# Patient Record
Sex: Male | Born: 1965 | Race: White | Hispanic: No | Marital: Married | Smoking: Former smoker
Health system: Southern US, Community
[De-identification: ages and names within clinical notes are randomized; demographics above are authoritative.]

## PROBLEM LIST (undated history)

## (undated) DIAGNOSIS — F419 Anxiety disorder, unspecified: Secondary | ICD-10-CM

## (undated) DIAGNOSIS — R7302 Impaired glucose tolerance (oral): Secondary | ICD-10-CM

## (undated) DIAGNOSIS — E785 Hyperlipidemia, unspecified: Secondary | ICD-10-CM

## (undated) DIAGNOSIS — K219 Gastro-esophageal reflux disease without esophagitis: Secondary | ICD-10-CM

## (undated) DIAGNOSIS — I10 Essential (primary) hypertension: Secondary | ICD-10-CM

## (undated) DIAGNOSIS — M62838 Other muscle spasm: Secondary | ICD-10-CM

## (undated) HISTORY — DX: Impaired glucose tolerance (oral): R73.02

## (undated) HISTORY — DX: Anxiety disorder, unspecified: F41.9

## (undated) HISTORY — DX: Essential (primary) hypertension: I10

## (undated) HISTORY — DX: Hyperlipidemia, unspecified: E78.5

## (undated) HISTORY — PX: APPENDECTOMY: SHX54

## (undated) HISTORY — DX: Gastro-esophageal reflux disease without esophagitis: K21.9

---

## 1999-03-13 ENCOUNTER — Emergency Department (HOSPITAL_COMMUNITY): Admission: EM | Admit: 1999-03-13 | Discharge: 1999-03-13 | Payer: Self-pay | Admitting: Emergency Medicine

## 1999-03-27 ENCOUNTER — Encounter: Admission: RE | Admit: 1999-03-27 | Discharge: 1999-03-27 | Payer: Self-pay | Admitting: Hematology and Oncology

## 1999-04-04 ENCOUNTER — Ambulatory Visit (HOSPITAL_COMMUNITY): Admission: RE | Admit: 1999-04-04 | Discharge: 1999-04-04 | Payer: Self-pay | Admitting: Internal Medicine

## 2006-04-25 ENCOUNTER — Emergency Department (HOSPITAL_COMMUNITY): Admission: EM | Admit: 2006-04-25 | Discharge: 2006-04-25 | Payer: Self-pay | Admitting: Emergency Medicine

## 2006-05-02 ENCOUNTER — Emergency Department (HOSPITAL_COMMUNITY): Admission: EM | Admit: 2006-05-02 | Discharge: 2006-05-02 | Payer: Self-pay | Admitting: Emergency Medicine

## 2011-01-13 ENCOUNTER — Other Ambulatory Visit: Payer: Self-pay | Admitting: Oral and Maxillofacial Surgery

## 2011-01-13 ENCOUNTER — Inpatient Hospital Stay (HOSPITAL_COMMUNITY)
Admission: AD | Admit: 2011-01-13 | Discharge: 2011-01-17 | DRG: 477 | Disposition: A | Discharge status: Home / Self Care | Payer: Self-pay | Source: Ambulatory Visit | Attending: Oral and Maxillofacial Surgery | Admitting: Oral and Maxillofacial Surgery

## 2011-01-13 ENCOUNTER — Inpatient Hospital Stay (HOSPITAL_COMMUNITY): Payer: Self-pay

## 2011-01-13 DIAGNOSIS — M629 Disorder of muscle, unspecified: Principal | ICD-10-CM | POA: Diagnosis present

## 2011-01-13 DIAGNOSIS — A419 Sepsis, unspecified organism: Secondary | ICD-10-CM | POA: Diagnosis not present

## 2011-01-13 DIAGNOSIS — Z23 Encounter for immunization: Secondary | ICD-10-CM

## 2011-01-13 DIAGNOSIS — M2420 Disorder of ligament, unspecified site: Principal | ICD-10-CM | POA: Diagnosis present

## 2011-01-13 DIAGNOSIS — H532 Diplopia: Secondary | ICD-10-CM | POA: Diagnosis not present

## 2011-01-13 DIAGNOSIS — M2749 Other cysts of jaw: Secondary | ICD-10-CM | POA: Diagnosis present

## 2011-01-13 DIAGNOSIS — K029 Dental caries, unspecified: Secondary | ICD-10-CM | POA: Diagnosis present

## 2011-01-13 DIAGNOSIS — R6520 Severe sepsis without septic shock: Secondary | ICD-10-CM | POA: Diagnosis not present

## 2011-01-13 DIAGNOSIS — J9600 Acute respiratory failure, unspecified whether with hypoxia or hypercapnia: Secondary | ICD-10-CM | POA: Diagnosis not present

## 2011-01-13 DIAGNOSIS — J988 Other specified respiratory disorders: Secondary | ICD-10-CM | POA: Diagnosis not present

## 2011-01-13 LAB — POCT I-STAT 3, ART BLOOD GAS (G3+)
O2 Saturation: 99 %
Patient temperature: 39.2
TCO2: 26 mmol/L (ref 0–100)
pH, Arterial: 7.366 (ref 7.350–7.450)

## 2011-01-14 ENCOUNTER — Inpatient Hospital Stay (HOSPITAL_COMMUNITY): Payer: Self-pay

## 2011-01-14 DIAGNOSIS — K122 Cellulitis and abscess of mouth: Secondary | ICD-10-CM

## 2011-01-14 DIAGNOSIS — A419 Sepsis, unspecified organism: Secondary | ICD-10-CM

## 2011-01-14 DIAGNOSIS — F05 Delirium due to known physiological condition: Secondary | ICD-10-CM

## 2011-01-14 DIAGNOSIS — J9600 Acute respiratory failure, unspecified whether with hypoxia or hypercapnia: Secondary | ICD-10-CM

## 2011-01-14 LAB — BASIC METABOLIC PANEL
BUN: 18 mg/dL (ref 6–23)
Chloride: 103 mEq/L (ref 96–112)
GFR calc Af Amer: >60 mL/min (ref >60)
GFR calc non Af Amer: 60 mL/min — ABNORMAL LOW (ref >60)
Potassium: 4.6 mEq/L (ref 3.5–5.1)
Sodium: 137 mEq/L (ref 135–145)

## 2011-01-14 LAB — COMPREHENSIVE METABOLIC PANEL
ALT: 23 U/L (ref 0–53)
AST: 29 U/L (ref 0–37)
CO2: 21 mEq/L (ref 19–32)
Calcium: 8.5 mg/dL (ref 8.4–10.5)
Chloride: 99 mEq/L (ref 96–112)
Creatinine, Ser: 1.44 mg/dL (ref 0.4–1.5)
GFR calc non Af Amer: 53 mL/min — ABNORMAL LOW (ref >60)
Glucose, Bld: 180 mg/dL — ABNORMAL HIGH (ref 70–99)
Total Bilirubin: 3.2 mg/dL — ABNORMAL HIGH (ref 0.3–1.2)

## 2011-01-14 LAB — CBC
MCH: 31.8 pg (ref 26.0–34.0)
MCHC: 34.7 g/dL (ref 30.0–36.0)
MCV: 91.4 fL (ref 78.0–100.0)
Platelets: 190 10*3/uL (ref 150–400)
Platelets: 205 10*3/uL (ref 150–400)
RBC: 4.52 MIL/uL (ref 4.22–5.81)
RDW: 15.1 % (ref 11.5–15.5)
WBC: 29.8 10*3/uL — ABNORMAL HIGH (ref 4.0–10.5)

## 2011-01-14 LAB — DIFFERENTIAL
Basophils Relative: 0 % (ref 0–1)
Eosinophils Absolute: 0 10*3/uL (ref 0.0–0.7)
Lymphs Abs: 0.9 10*3/uL (ref 0.7–4.0)
Monocytes Absolute: 2.2 10*3/uL — ABNORMAL HIGH (ref 0.1–1.0)
Neutro Abs: 28.4 10*3/uL — ABNORMAL HIGH (ref 1.7–7.7)

## 2011-01-14 LAB — GLUCOSE, CAPILLARY
Glucose-Capillary: 184 mg/dL — ABNORMAL HIGH (ref 70–99)
Glucose-Capillary: 168 mg/dL — ABNORMAL HIGH (ref 70–99)
Glucose-Capillary: 140 mg/dL — ABNORMAL HIGH (ref 70–99)
Glucose-Capillary: 159 mg/dL — ABNORMAL HIGH (ref 70–99)

## 2011-01-14 LAB — POCT I-STAT 3, ART BLOOD GAS (G3+)
Patient temperature: 98.9
TCO2: 24 mmol/L (ref 0–100)
pCO2 arterial: 40.8 mmHg (ref 35.0–45.0)
pH, Arterial: 7.358 (ref 7.350–7.450)

## 2011-01-14 LAB — URINALYSIS, ROUTINE W REFLEX MICROSCOPIC
Ketones, ur: NEGATIVE mg/dL
Protein, ur: NEGATIVE mg/dL
Urine Glucose, Fasting: 250 mg/dL — AB
Urobilinogen, UA: 4 mg/dL — ABNORMAL HIGH (ref 0.0–1.0)

## 2011-01-14 LAB — URINE MICROSCOPIC-ADD ON

## 2011-01-14 LAB — POCT I-STAT, CHEM 8
Calcium, Ion: 1.13 mmol/L (ref 1.12–1.32)
Chloride: 102 mEq/L (ref 96–112)
Creatinine, Ser: 1.5 mg/dL (ref 0.4–1.5)
Glucose, Bld: 173 mg/dL — ABNORMAL HIGH (ref 70–99)
Hemoglobin: 16 g/dL (ref 13.0–17.0)
Potassium: 4.1 mEq/L (ref 3.5–5.1)

## 2011-01-14 LAB — LACTIC ACID, PLASMA: Lactic Acid, Venous: 1.7 mmol/L (ref 0.5–2.2)

## 2011-01-14 LAB — PROTIME-INR: Prothrombin Time: 15.4 seconds — ABNORMAL HIGH (ref 11.6–15.2)

## 2011-01-15 LAB — GLUCOSE, CAPILLARY
Glucose-Capillary: 109 mg/dL — ABNORMAL HIGH (ref 70–99)
Glucose-Capillary: 126 mg/dL — ABNORMAL HIGH (ref 70–99)
Glucose-Capillary: 126 mg/dL — ABNORMAL HIGH (ref 70–99)
Glucose-Capillary: 156 mg/dL — ABNORMAL HIGH (ref 70–99)
Glucose-Capillary: 157 mg/dL — ABNORMAL HIGH (ref 70–99)
Glucose-Capillary: 167 mg/dL — ABNORMAL HIGH (ref 70–99)

## 2011-01-15 LAB — BASIC METABOLIC PANEL
Chloride: 100 mEq/L (ref 96–112)
GFR calc Af Amer: >60 mL/min (ref >60)
GFR calc non Af Amer: >60 mL/min (ref >60)
Potassium: 4.5 mEq/L (ref 3.5–5.1)

## 2011-01-15 LAB — CBC
Platelets: 227 10*3/uL (ref 150–400)
RBC: 4.47 MIL/uL (ref 4.22–5.81)
RDW: 15.1 % (ref 11.5–15.5)
WBC: 27.4 10*3/uL — ABNORMAL HIGH (ref 4.0–10.5)

## 2011-01-15 LAB — POCT I-STAT 4, (NA,K, GLUC, HGB,HCT)
Potassium: 3.9 mEq/L (ref 3.5–5.1)
Sodium: 134 mEq/L — ABNORMAL LOW (ref 135–145)

## 2011-01-16 LAB — CBC
Hemoglobin: 14.2 g/dL (ref 13.0–17.0)
MCH: 31.9 pg (ref 26.0–34.0)
MCHC: 35.1 g/dL (ref 30.0–36.0)
MCV: 90.8 fL (ref 78.0–100.0)

## 2011-01-16 LAB — GLUCOSE, CAPILLARY
Glucose-Capillary: 121 mg/dL — ABNORMAL HIGH (ref 70–99)
Glucose-Capillary: 116 mg/dL — ABNORMAL HIGH (ref 70–99)

## 2011-01-17 ENCOUNTER — Other Ambulatory Visit (HOSPITAL_COMMUNITY): Payer: Self-pay

## 2011-01-17 ENCOUNTER — Inpatient Hospital Stay (HOSPITAL_COMMUNITY)
Admission: EM | Admit: 2011-01-17 | Discharge: 2011-01-19 | DRG: 502 | Disposition: A | Discharge status: Home / Self Care | Payer: Self-pay | Attending: Oral and Maxillofacial Surgery | Admitting: Oral and Maxillofacial Surgery

## 2011-01-17 ENCOUNTER — Emergency Department (HOSPITAL_COMMUNITY): Payer: Self-pay

## 2011-01-17 DIAGNOSIS — M2420 Disorder of ligament, unspecified site: Principal | ICD-10-CM | POA: Diagnosis present

## 2011-01-17 DIAGNOSIS — K029 Dental caries, unspecified: Secondary | ICD-10-CM | POA: Diagnosis present

## 2011-01-17 DIAGNOSIS — M629 Disorder of muscle, unspecified: Principal | ICD-10-CM | POA: Diagnosis present

## 2011-01-17 DIAGNOSIS — M2749 Other cysts of jaw: Secondary | ICD-10-CM | POA: Diagnosis present

## 2011-01-17 LAB — POCT I-STAT, CHEM 8
BUN: 17 mg/dL (ref 6–23)
Calcium, Ion: 1.07 mmol/L — ABNORMAL LOW (ref 1.12–1.32)
Creatinine, Ser: 0.9 mg/dL (ref 0.4–1.5)
Glucose, Bld: 104 mg/dL — ABNORMAL HIGH (ref 70–99)
Hemoglobin: 15.3 g/dL (ref 13.0–17.0)
Sodium: 132 mEq/L — ABNORMAL LOW (ref 135–145)
TCO2: 25 mmol/L (ref 0–100)

## 2011-01-17 LAB — CBC
MCH: 31.6 pg (ref 26.0–34.0)
MCHC: 35.8 g/dL (ref 30.0–36.0)
MCV: 88.1 fL (ref 78.0–100.0)
Platelets: 300 10*3/uL (ref 150–400)
RBC: 4.88 MIL/uL (ref 4.22–5.81)

## 2011-01-17 LAB — DIFFERENTIAL
Basophils Relative: 5 % — ABNORMAL HIGH (ref 0–1)
Eosinophils Absolute: 0.2 10*3/uL (ref 0.0–0.7)
Eosinophils Relative: 1 % (ref 0–5)
Lymphs Abs: 2.7 10*3/uL (ref 0.7–4.0)
Monocytes Absolute: 3 10*3/uL — ABNORMAL HIGH (ref 0.1–1.0)
Neutro Abs: 15.7 10*3/uL — ABNORMAL HIGH (ref 1.7–7.7)
Neutrophils Relative %: 69 % (ref 43–77)

## 2011-01-17 LAB — COMPREHENSIVE METABOLIC PANEL
AST: 24 U/L (ref 0–37)
Albumin: 3.1 g/dL — ABNORMAL LOW (ref 3.5–5.2)
BUN: 15 mg/dL (ref 6–23)
Calcium: 8.9 mg/dL (ref 8.4–10.5)
Chloride: 94 mEq/L — ABNORMAL LOW (ref 96–112)
Creatinine, Ser: 0.79 mg/dL (ref 0.4–1.5)
GFR calc Af Amer: >60 mL/min (ref >60)
GFR calc non Af Amer: >60 mL/min (ref >60)
Total Bilirubin: 2.2 mg/dL — ABNORMAL HIGH (ref 0.3–1.2)

## 2011-01-17 LAB — GLUCOSE, CAPILLARY: Glucose-Capillary: 112 mg/dL — ABNORMAL HIGH (ref 70–99)

## 2011-01-17 MED ORDER — IOHEXOL 300 MG/ML  SOLN
75.0000 mL | Freq: Once | INTRAMUSCULAR | Status: AC | PRN
  Administered 2011-01-17: 75 mL via INTRAVENOUS

## 2011-01-18 LAB — GLUCOSE, CAPILLARY: Glucose-Capillary: 133 mg/dL — ABNORMAL HIGH (ref 70–99)

## 2011-01-18 LAB — CULTURE, ROUTINE-ABSCESS: Gram Stain: NONE SEEN

## 2011-01-18 LAB — ANAEROBIC CULTURE

## 2011-01-19 LAB — CBC
Platelets: 388 10*3/uL (ref 150–400)
RDW: 14.9 % (ref 11.5–15.5)
WBC: 21.8 10*3/uL — ABNORMAL HIGH (ref 4.0–10.5)

## 2011-01-19 LAB — BASIC METABOLIC PANEL
GFR calc non Af Amer: >60 mL/min (ref >60)
Potassium: 4.7 mEq/L (ref 3.5–5.1)
Sodium: 132 mEq/L — ABNORMAL LOW (ref 135–145)

## 2011-01-21 NOTE — Op Note (Signed)
Ronnie Diaz, Ronnie [name] NO.:  [id number]  MEDICAL RECORD NO.:  [id number]           PATIENT TYPE:  I  LOCATION:  2302                         FACILITY:  [facility]  PHYSICIAN:  Lincoln Brigham, DDS DATE OF BIRTH:  1966/03/12  DATE OF PROCEDURE:  01/13/2011 DATE OF DISCHARGE:  01/17/2011                              OPERATIVE REPORT   PREOPERATIVE DIAGNOSES:  Left fascial space infection of the head and neck, cyst of left mandible and carious nonrestorable teeth numbers 17 and 18.  POSTOPERATIVE DIAGNOSES:  Left fascial space infection of the head and neck, cyst of left mandible, and nonrestorable carious teeth numbers 17 and 18.  SURGEON:  Adeja Sarratt L. Hickman, DDS  NAME OF OPERATION:  Extraction of teeth #17 and 18, incision and drainage of left temporal, left vestibular, left buccal, left submandibular, left pterygomandibular, left lateral pharyngeal space infections and biopsy of cyst of left mandible.  INDICATIONS FOR PROCEDURE:  The patient is a 45 year old white male who presented to my office on Monday, January 13, 2011, with the chief complaint of left facial swelling.  Upon exam, the patient was noted to have slightly mobile #17 and 18.  The patient also had left facial swelling which was significant and submandibular region extending up to the temporal region.  The patient also had trismus with opening only about 5 mm.  The patient could not protrude the tongue between his teeth.  The patient was also febrile.  At that point it was deemed necessary that the patient will be taken to the operating room for incision and drainage of the left fascial spaces and removal of the teeth.  Panorex exam revealed the patient had a large odontogenic cyst in the left mandible which apparently was secondarily infected from teeth #17 and 18.  CT scan revealed the patient had soft tissue mass effect which was resulting in narrowing of airway.  This allowed  the surgery to be deemed as urgent as the patient had pending airway doom.  PROCEDURE IN DETAILS:  The patient was seen in the preoperative area. Consent was verified and signed by the patient.  The patient's history and physical was updated and verified.  All the patient's questions were answered.  The patient was taken to the operating room by Anesthesia. The patient was then placed on the bed in a supine position.  The patient had awake fiberoptic intubation using a nasal endotracheal tube. At this point tube was secured and the patient was turned over to the care of myself.  The patient was prepped and draped in the usual sterile fashion for maxillofacial surgery procedure.  A throat pack was then placed which was a moistened Ray-Tec in the patient's oropharynx.  At this point local anesthesia was then used to anesthetize the face and intraorally, approximately 5.4 mL of 2% lidocaine with 1:100 epinephrine were used to anesthetize the left temporal region, the left submandibular region, and then intraorally the tissue surrounding teeth #17 and 18 along with the left inferior alveolar nerve block.  At this point a 15 blade was then used to make an incision 2 cm below the inferior border  in the left mandible and then this incision was carried down through skin and subcutaneous tissue.  At this point hemostat was used to bluntly dissect to the inferior border of the mandible and then to the medial aspect of the mandible.  Of note, an 18-gauge needle on a 10 mL syringe was then used to aspirate purulent material.  First attempt was extraorally and then the second attempt was intraorally aspirated approximately 3 mL of purulent matter which was sent off to microbiology for culture.  This was done prior to making extraoral incisions.  Hemostat was then used to dissect along the mandible on the medial aspect through the submandibular space, pterygomandibular space, and then the lateral  pharyngeal spaces.  Copious purulence were then expressed from the incision site.  At this point the hemostat was then extended to the buccal and vestibular space on the lateral aspect of the mandible.  Minimal purulence was found there.  Then our attention was turned to the left temporal region, 15 blade was then used to make approximately a 5-mm incision in the region of the left temporal area consistent with a Cheryll Dessert approach.  The incision was carried through skin and subcutaneous tissue.  Then a hemostat was then used to dissect down to the temporalis fascia.  This was carried through the superficial temporal space and then deep into the deep temporal space.  No purulence was returned from this area.  At this point we turned our attention to the intraoral region.  A mouth prop was then used to open the patient's mouth and then teeth #17 and 18 were then removed using a 15 blade periosteal and then dental elevators.  Teeth were delivered with a forceps.  At this point a rongeur was then used to remove the intraseptal bone between #17 and 18 to access the cystic area associated with lesion in the mandible.  A 15 blade was then used to incise a portion of the cystic lining to perform an incisional biopsy.  This specimen was then sent for pathology.  During the biopsy copious amounts of keratin-like material were expressed from the cyst along with purulent drainage.  Copious irrigation using normal saline was then used to irrigate the cystic area along with the extraction sockets.  Of note, the cyst was inferior to the roots of #17 and 18.  The cyst extended anteriorly according to the Panorex dental x-ray and CT scan into the premolar region.  At this point a second round of irrigation was then used and then our attention was turned extraorally once more and we placed quarter of an inch Penrose drains at the temporal region and then an additional Penrose drain was placed into the  submandibular, pterygomandibular, lateral pharyngeal spaces and then a third Penrose drain which was quarter of an inch was placed on the lateral aspect of the mandible into the buccal and vestibular spaces.  All three Penrose drains were secured with 2-0 silk sutures and drain sponges applied.  At this point, throat pack was then removed from the patient's mouth.  The patient was then turned over to the care of anesthesia.  The patient remained intubated at the conclusion of the case.  Due to the patient's edematous airway, an NG tube was placed into the patient's nose and into the stomach for the purpose of having postoperative feedings.  The patient was then taken to the critical care ICU in a sedated fashion.  FINDINGS:  Copious amounts of purulent drainage from the  left fascial spaces and a cyst associated with the left mandible.  SPECIMENS REMOVED:  Incisional biopsy of the left mandibular cyst and culture of the purulent drainage was sent to microbiology.  The patient's condition following the surgery stable but sedated.  COMPLICATIONS:  None.          ______________________________ Lincoln Brigham, DDS     CD/MEDQ  D:  01/15/2011  T:  01/16/2011  Job:  [id number]  Electronically Signed by Lincoln Brigham DDS on 01/21/2011 05:45:10 PM

## 2011-01-24 LAB — CULTURE, BLOOD (ROUTINE X 2)
Culture  Setup Time: 201202250240
Culture: NO GROWTH

## 2011-03-10 ENCOUNTER — Encounter (HOSPITAL_COMMUNITY)
Admission: RE | Admit: 2011-03-10 | Discharge: 2011-03-10 | Disposition: A | Discharge status: Home / Self Care | Payer: Self-pay | Source: Ambulatory Visit | Attending: Oral and Maxillofacial Surgery | Admitting: Oral and Maxillofacial Surgery

## 2011-03-10 ENCOUNTER — Other Ambulatory Visit (HOSPITAL_COMMUNITY): Payer: Self-pay | Admitting: Oral and Maxillofacial Surgery

## 2011-03-10 ENCOUNTER — Ambulatory Visit (HOSPITAL_COMMUNITY)
Admission: RE | Admit: 2011-03-10 | Discharge: 2011-03-10 | Disposition: A | Discharge status: Home / Self Care | Payer: Self-pay | Source: Ambulatory Visit | Attending: Oral and Maxillofacial Surgery | Admitting: Oral and Maxillofacial Surgery

## 2011-03-10 DIAGNOSIS — K029 Dental caries, unspecified: Secondary | ICD-10-CM | POA: Insufficient documentation

## 2011-03-10 DIAGNOSIS — Z01812 Encounter for preprocedural laboratory examination: Secondary | ICD-10-CM | POA: Insufficient documentation

## 2011-03-10 DIAGNOSIS — R059 Cough, unspecified: Secondary | ICD-10-CM | POA: Insufficient documentation

## 2011-03-10 DIAGNOSIS — Z01811 Encounter for preprocedural respiratory examination: Secondary | ICD-10-CM | POA: Insufficient documentation

## 2011-03-10 DIAGNOSIS — R05 Cough: Secondary | ICD-10-CM | POA: Insufficient documentation

## 2011-03-10 DIAGNOSIS — Z01810 Encounter for preprocedural cardiovascular examination: Secondary | ICD-10-CM | POA: Insufficient documentation

## 2011-03-10 DIAGNOSIS — Z87891 Personal history of nicotine dependence: Secondary | ICD-10-CM | POA: Insufficient documentation

## 2011-03-10 LAB — SURGICAL PCR SCREEN
MRSA, PCR: NEGATIVE
Staphylococcus aureus: NEGATIVE

## 2011-03-10 LAB — CBC
Hemoglobin: 15.9 g/dL (ref 13.0–17.0)
MCH: 31.2 pg (ref 26.0–34.0)
MCHC: 35.3 g/dL (ref 30.0–36.0)
RBC: 5.09 MIL/uL (ref 4.22–5.81)
RDW: 14.6 % (ref 11.5–15.5)
WBC: 12.2 10*3/uL — ABNORMAL HIGH (ref 4.0–10.5)

## 2011-03-17 ENCOUNTER — Ambulatory Visit (HOSPITAL_COMMUNITY)
Admission: RE | Admit: 2011-03-17 | Discharge: 2011-03-17 | Disposition: A | Discharge status: Home / Self Care | Payer: Self-pay | Source: Ambulatory Visit | Attending: Oral and Maxillofacial Surgery | Admitting: Oral and Maxillofacial Surgery

## 2011-03-17 ENCOUNTER — Other Ambulatory Visit: Payer: Self-pay | Admitting: Oral and Maxillofacial Surgery

## 2011-03-17 DIAGNOSIS — K029 Dental caries, unspecified: Secondary | ICD-10-CM | POA: Insufficient documentation

## 2011-03-17 DIAGNOSIS — K056 Periodontal disease, unspecified: Secondary | ICD-10-CM | POA: Insufficient documentation

## 2011-03-17 DIAGNOSIS — K069 Disorder of gingiva and edentulous alveolar ridge, unspecified: Secondary | ICD-10-CM | POA: Insufficient documentation

## 2011-03-17 DIAGNOSIS — K090 Developmental odontogenic cysts: Secondary | ICD-10-CM | POA: Insufficient documentation

## 2011-03-20 NOTE — Op Note (Signed)
Ronnie Diaz, Ronnie [name] NO.:  [id number]  MEDICAL RECORD NO.:  [id number]           PATIENT TYPE:  O  LOCATION:  [facility]SC                         FACILITY:  [facility]  PHYSICIAN:  Lincoln Brigham, DDSDATE OF BIRTH:  1966/02/25  DATE OF PROCEDURE:  03/17/2011 DATE OF DISCHARGE:  03/17/2011                              OPERATIVE REPORT   SURGEON:  Lincoln Brigham, DDS.  TIME OF SURGERY:  8:07 a.m.  PREOPERATIVE DIAGNOSIS:  Odontogenic keratocyst of the left mandible, also root tips #5 and #20 none, #1 and #32 with periodontal disease.  POSTOPERATIVE DIAGNOSIS:  Odontogenic keratocyst of the left mandible, caries and root tips at teeth number 5 and 29, periodontal disease with vertical bone loss associated with #1 and #32 and nonrestorable #19 associated with odontogenic keratocyst.  INDICATIONS FOR PROCEDURE:  The patient is a 45 year old white male originally presenting to my office on January 13, 2011, with left facial swelling and an odontogenic cyst in the left mandible.  At that time, the patient was taken to [facility] for incision and drainage, after which, the patient remained in hospital for approximately 1 week. The patient was discharged and then readmitted on January 17, 2011, after which, the patient was taken back to the operating room and had a second surgery to perform incision and drainage in the left fascial spaces.  The patient was ultimately discharged on January 19, 2011. Now that infection has resolved, we need to return to the operating room for enucleation and curettage of the left mandibular odontogenic keratocyst which was previously biopsied at his initial hospital presentation in February, such will also require extraction of teeth which were carious and nonrestorable.  These teeth include #5, #1, #29, and #32.  The patient will also require removal of tooth #19 as it is in the region of the odontogenic keratocyst and its removal  will be required in order to enucleate the cyst.  PROCEDURE:  Enucleation and curettage of the odontogenic keratocyst of the left mandible and extraction of teeth numbers 1, 5, 19, 29, and #32.  PROCEDURE IN DETAIL:  The patient was seen by Anesthesia in the preoperative area.  I evaluated the patient as well.  Consent was verified.  History and physical was updated.  All of the patient's questions were answered.  The patient was marked and the patient was taken to the operating room.  Once in the operating room, the patient was placed on the table in the supine position and intubated by Anesthesia and placed under general anesthesia without complication.  At this point, the patient was turned over to me.  The patient was prepped and draped in the usual sterile fashion for maxillofacial surgery procedures.  A moistened Ray-Tec was placed in the patient's mouth after performing a time-out and local anesthetic was placed to anesthetize the patient's mouth in the regions of surgery.  Approximately, 7.2 mL of 0.5% Marcaine with 1:200 epinephrine and 10.2 mL of 2% lidocaine with 1:100 epinephrine were used to anesthetize the region of the right and left inferior alveolar nerve, right and left long buccal nerve and infiltrations around teeth #1 and #5  to provide local anesthesia to the patient's surgical field.  At this point, a 15 blade was then used to make a circular incision around teeth #1, 5, 19, 29, and 32.  At this point, periosteal elevator was then used to reflect the tissue from the flap and then at this point, an __________ elevator was then used to elevate the teeth and the teeth were delivered with surgical forceps. Teeth numbers 29 and 32 also required removal of some crustal bone using a striker drill.  At this point, our attention was turned to the left mandible where a round bur was then used to remove the superior aspect of bone covering the odontogenic cyst.  At this  point, a periosteal elevator was then used to dissect along the cyst wall and free it from the bone.  Also, dental curettes and a Lorette Ang were also used to reflect the cyst from the bony wall.  A hemostat was then used to remove the cyst wall from the bony crypt.  This was sent to pathology.  Next, a round bur was then used to perform a peripheral ostectomy of the bone. Copious amounts of irrigation were used.  At this point, a Nu-Gauze was then placed into the surgical wound within the bone and 3-0 chromic gut was then used to suture the flap and close the wound, leaving a small amount of Nu-Gauze exposed into the oral cavity which would be removed in approximately 2 days.  Copious irrigation was then used of normal saline.  The patient's mouth was then suctioned with the Yankauer suction and then throat pack was then removed.  All counts were correct at the conclusion of the case x2.  The patient was then returned to the care of Anesthesia where he was extubated in a stable fashion.  The patient was then taken to the recovery area.  SPECIMENS:  Odontogenic keratocyst of left mandible sent to pathology.  COMPLICATIONS:  None.  ESTIMATED BLOOD LOSS:  Minimal.          ______________________________ Lincoln Brigham, DDS     CD/MEDQ  D:  03/18/2011  T:  03/18/2011  Job:  [id number]  Electronically Signed by Lincoln Brigham DDS on 03/20/2011 01:58:42 PM

## 2011-03-20 NOTE — Op Note (Signed)
Ronnie Diaz, RECORD            ACCOUNT NO.:  [id number]  MEDICAL RECORD NO.:  [id number]           PATIENT TYPE:  E  LOCATION:  [facility]                         FACILITY:  [facility]  PHYSICIAN:  Lincoln Brigham, DDSDATE OF BIRTH:  [birth year]  DATE OF PROCEDURE:  01/18/2011 DATE OF DISCHARGE:                              OPERATIVE REPORT   TIME OF OPERATION:  Approximately 09:05.  PREOPERATIVE DIAGNOSIS:  Left fascial space infection.  POSTOPERATIVE DIAGNOSIS:  Left fascial space infection of the head and neck including left submandibular, left pterygomandibular, left lateral pharyngeal, submental space, left buccal space, and inferotemporal space infection.  NAME OF PROCEDURE:  Incision and drainage of left fascial spaces of the head and neck with extraoral and intraoral approaches.  INDICATIONS FOR PROCEDURE:  The patient is a 45 year old white male with left facial swelling, originally presenting to my office on January 13, 2011.  The patient was admitted for IV antibiotics and was taken to the OR at [facility] on January 13, 2011, for incision and drainage of the left fascial spaces.  The patient had a fiberoptic intubation prior to the surgery and remained intubated in the ICU for several days status post the initial incision and drainage of the left fascial space.  The patient also, at that point in time, with the initial operation had extraction of #17 and 18 and a biopsy of a left mandibular cyst which was present.  The patient was extubated on January 15, 2011, and was sent to a general floor bed on January 16, 2011.  The patient remained afebrile and showed acute decrease in white count from this time.  The patient was discharged to home on January 17, 2011.  Once at home, patient felt that his dysphonia and dysphagia had returned and was readmitted to the hospital on January 17, 2011.  The patient had a CT scan in the ER, which showed again a submental space  infection and a submandibular abscess.  Also, the CT scan showed inflammatory changes in the region of the left infratemporal and left buccal area.  The patient was deemed necessary to go to the operating room for further incision and drainage.  DESCRIPTION OF PROCEDURE:  The patient was seen by Anesthesia.  The patient was evaluated and deemed appropriate for surgery.  The patient's consent and H and P were verified and updated.  The patient was taken to the operating room and placed on the table in a supine position.  The patient was then intubated without complication.  The patient was then turned over to the care of myself.  The patient was prepped and draped in the usual sterile fashion for oral maxillofacial surgery procedures. A time-out was performed, then a moistened Ray-Tec was then placed in the patient's mouth.  The patient's mouth was prepped with Betadine and irrigated.  At this point, local anesthesia was then used to provide anesthesia to the extraoral and intraoral areas.  Of interest, 2% lidocaine with 1:100 epinephrine was then used to anesthetize the left submandibular region and intraorally the left inferior alveolar nerve. Approximately 5.4 mL of local anesthesia was used.  At this point,  our attention was turned to the previous submandibular incision which was created, and a Kelly hemostat was then used to enter the wound and palpate the mandible and then reopen the submandibular, pterygomandibular, and the lateral pharyngeal spaces.  Copious purulence was then expressed from this wound.  This wound was irrigated with a 60- mL syringe, using a red rubber catheter.  At this point, a 15-blade was then used to make an incision anterior to the left submandibular incision which was previously made, approximately 1 cm below the inferior border, into the parasymphyseal region.  This 15-blade was carried then through the skin and subcutaneous tissue.  At this point,  a hemostat was then used to perform blunt dissection into the submental space.  This hemostat was then used and taken through the right lateral aspect of the submental space, and then a 15- blade was then used to sharply incise at the point of the hemostat.  No purulence was yielded from the submental space; however, there was an empty space with gas present.  This area was copiously irrigated.  Then, our attention was turned intraorally to the left buccal and left infratemporal regions, associated with the region of #16.  A 15-blade was then used to make an incision.  This incision was carried down to bone, and a hemostat was then used to enter into the space of the infratemporal fossa and then out laterally into the buccal space.  Copious amounts of irrigation were performed, prior to that, purulence was expressed from this incision.  A Penrose drain was then placed and secured with a 2-0 silk suture, that was quarter of an inch Penrose drain.  Also, extraorally, a quarter of an inch Penrose drain was then placed into submental space.  Finally, a quarter-inch Penrose drain along with a red rubber catheter was insertedinto the left submandibular, left pterygomandibular, and left lateral pharyngeal spaces.  There was one drain, however, that extended through all three of the previous spaces.  At this point, patient's mouth was then irrigated with normal saline and suctioned, and the throat pack was then removed.  The patient's external incision sites were dressed with dressing and secured with Medipore tape.  All counts were correct x2 at the conclusion of the case.  The patient was then returned to the care of Anesthesia.  Prior to returning to the care of Anesthesia, a nasogastric tube was attempted x3 into the left and right nares; however, this was unsuccessful.  The patient was then turned over to the care of anesthesia and was extubated in a stable fashion, taken to  the PACU.  FINDINGS:  Purulence from the left infratemporal buccal spaces along with the mandibular space.  The submental space had only gas collection. The patient's condition following the procedure is stable.  COMPLICATIONS:  None.  ESTIMATED BLOOD LOSS:  Minimal.  SPECIMENS:  None.          ______________________________ Lincoln Brigham, DDS     CD/MEDQ  D:  01/21/2011  T:  01/22/2011  Job:  [id number]  Electronically Signed by Lincoln Brigham DDS on 03/20/2011 01:52:20 PM

## 2011-03-20 NOTE — Discharge Summary (Signed)
  Ronnie Diaz, Ronnie [name] NO.:  [id number]  MEDICAL RECORD NO.:  [id number]           PATIENT TYPE:  O  LOCATION:  [facility]SC                         FACILITY:  [facility]  PHYSICIAN:  Lincoln Brigham, DDSDATE OF BIRTH:  September 22, 1966  DATE OF ADMISSION:  03/17/2011 DATE OF DISCHARGE:  03/17/2011                              DISCHARGE SUMMARY   The patient was taken to the operating room for enucleation and curettage of a left mandibular odontogenic keratocyst.  The patient also had the extraction of teeth #1, #5, #19, #29, and #32 as the teeth were carious and nonrestorable and with periodontal disease tooth #19 specifically was involved in odontogenic keratocyst and the removal was necessary in order to enucleate the cyst.  The patient was taken to the operating room and procedure was carried out.  The patient was stable following the surgery.  The patient was discharged to home with prescriptions being prescribed prior to the surgery.  FINAL DIAGNOSES:  Odontogenic keratocyst of the left mandible, carious root tips #5 and #29, periodontal disease #1 and #32, and nonrestorable #19.  FINDINGS:  Cyst of left mandible, caries root tips and periodontal disease.  PROCEDURE PERFORMED:  Enucleation curettage of the left mandible and extraction of #1, #5, #19, #29, and #32.          ______________________________ Lincoln Brigham, DDS     CD/MEDQ  D:  03/18/2011  T:  03/18/2011  Job:  [id number]  Electronically Signed by Lincoln Brigham DDS on 03/20/2011 01:58:39 PM

## 2011-03-24 NOTE — Discharge Summary (Signed)
  NAMEAYVIN, LIPINSKI            ACCOUNT NO.:  [id number]  MEDICAL RECORD NO.:  [id number]           PATIENT TYPE:  I  LOCATION:  2302                         FACILITY:  [facility]  PHYSICIAN:  Lincoln Brigham, DDSDATE OF BIRTH:  June 04, 1966  DATE OF ADMISSION:  01/13/2011 DATE OF DISCHARGE:  01/17/2011                              DISCHARGE SUMMARY   REASON FOR HOSPITALIZATION:  The patient is a 45 year old white male who originally presented to my office with left facial swelling on January 13, 1011.  The patient was admitted to [facility] for IV antibiotics and to be taken to the operating room for incision and drainage of the left fascial spaces.  This was done on January 13, 2011.  The patient had a fiberoptic intubation prior to the surgery due to the patient's swelling into the oropharynx.  Of note, the patient had a left mandibular odontogenic keratocyst along with carious teeth #17 and #18, which caused to felt a left facial swelling.  The patient had a fiberoptic intubation and then remained intubated and was taken to the ICU for several days status post incision and drainage of the left fascial spaces.  The patient was extubated on January 15, 2011, and was sent to a general floor bed on January 16, 2011.  The patient remained afebrile and showed acute decrease in the white count from his initial visit in the hospital.  His initial white count at the time of presentation was 31.5, at the time of discharge it was down to 22.7. While on the floor, the patient remained afebrile and showed great improvement and was deemed appropriate for discharge.  On January 17, 2011, the patient was discharged home on oral antibiotics Augmentin and Flagyl along with pain medication Dilaudid as well as a stool softener docusate.  The patient was given follow-up in my office during the following week.  DIAGNOSIS:  Carious teeth #17 and #18, and left fascial space infection of  the head and neck.  PROCEDURES PERFORMED DURING THE HOSPITAL STAY:  Include incision and drainage of left fascial spaces of the head and neck with intraoral and extraoral approaches, and extraction of #17 and 18.          ______________________________ Lincoln Brigham, DDS     CD/MEDQ  D:  03/23/2011  T:  03/24/2011  Job:  [id number]  Electronically Signed by Lincoln Brigham DDS on 03/24/2011 07:43:24 AM

## 2011-03-25 NOTE — Discharge Summary (Signed)
  NAMEPASQUALINO, [name] NO.:  [id number]  MEDICAL RECORD NO.:  [id number]          PATIENT TYPE:  LOCATION:                                 FACILITY:  PHYSICIAN:  Lincoln Brigham, DDSDATE OF BIRTH:  November 26, 1965  DATE OF ADMISSION: DATE OF DISCHARGE:                              DISCHARGE SUMMARY   REASON FOR HOSPITALIZATION:  The patient is a 45 year old white male originally presenting to my office on January 13, 2011, for left facial swelling and an odontogenic cyst of the left mandible.  The patient was admitted for IV antibiotics and was taken to the OR at [facility] that same day for incision and drainage and removal of teeth #17 and #18. The patient had a fiberoptic intubation prior to the surgery and was taken to the ICU after incision and drainage in the operating room of the left fascial space and extraction of #17 and #18 and biopsy of the left mandibular cyst.  Of note, the patient remained intubated in the ICU until January 15, 2011, and then was sent to the general floor on January 16, 2011.  The patient remained afebrile and showed a decreasing white count during this time.  The patient was able to take a p.o. diet and phonate and he was discharged home on January 17, 2011, with p.o. antibiotics, pain medicine, and stool softeners.  Once the patient was at home, the patient felt dysphonia and dysphagia, had returned, and was then returned to [facility] ER.  The patient was then readmitted where a CT scan was then performed which showed a submental and submandibular abscess and it also showed the previously operating areas of the left fascial spaces.  The patient was then taken back to the operating room on January 18, 2011, for a second incision and drainage of the left fascial spaces including the submental and submandibular spaces.  After a second incision and drainage, the patient improved greatly.  dysphonia and dysphonia resolved.  The  patient remained afebrile and white count continued to decrease.  The patient was then discharged on January 19, 2011, home.  The patient was already having previous prescriptions, was not prescribed any further prescriptions.  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  PROCEDURE PERFORMED:  Incision and drainage of left fascial space.  At previous admission, the patient had incision and drainage of left fascial space, extraction of #17 and #18, and a biopsy of the left odontogenic cyst.  This odontogenic cyst was deemed by pathology to be an odontogenic keratocyst of the left mandible.          ______________________________ Lincoln Brigham, DDS     CD/MEDQ  D:  03/23/2011  T:  03/24/2011  Job:  [id number]  Electronically Signed by Lincoln Brigham DDS on 03/25/2011 06:02:17 PM

## 2017-02-16 ENCOUNTER — Encounter (HOSPITAL_COMMUNITY): Payer: Self-pay | Admitting: Emergency Medicine

## 2017-02-16 ENCOUNTER — Observation Stay (HOSPITAL_COMMUNITY)
Admission: EM | Admit: 2017-02-16 | Discharge: 2017-02-17 | Disposition: A | Discharge status: Home / Self Care | Payer: BLUE CROSS/BLUE SHIELD | Attending: Internal Medicine | Admitting: Internal Medicine

## 2017-02-16 ENCOUNTER — Emergency Department (HOSPITAL_COMMUNITY): Payer: BLUE CROSS/BLUE SHIELD

## 2017-02-16 DIAGNOSIS — R079 Chest pain, unspecified: Secondary | ICD-10-CM

## 2017-02-16 DIAGNOSIS — E860 Dehydration: Secondary | ICD-10-CM | POA: Diagnosis not present

## 2017-02-16 DIAGNOSIS — N179 Acute kidney failure, unspecified: Secondary | ICD-10-CM | POA: Insufficient documentation

## 2017-02-16 DIAGNOSIS — Z79899 Other long term (current) drug therapy: Secondary | ICD-10-CM | POA: Insufficient documentation

## 2017-02-16 DIAGNOSIS — E785 Hyperlipidemia, unspecified: Secondary | ICD-10-CM | POA: Diagnosis present

## 2017-02-16 DIAGNOSIS — I10 Essential (primary) hypertension: Secondary | ICD-10-CM | POA: Diagnosis not present

## 2017-02-16 DIAGNOSIS — R072 Precordial pain: Principal | ICD-10-CM | POA: Insufficient documentation

## 2017-02-16 DIAGNOSIS — F17210 Nicotine dependence, cigarettes, uncomplicated: Secondary | ICD-10-CM | POA: Insufficient documentation

## 2017-02-16 DIAGNOSIS — K219 Gastro-esophageal reflux disease without esophagitis: Secondary | ICD-10-CM | POA: Diagnosis present

## 2017-02-16 HISTORY — DX: Other muscle spasm: M62.838

## 2017-02-16 LAB — CBC
HEMATOCRIT: 49.1 % (ref 39.0–52.0)
Hemoglobin: 17.3 g/dL — ABNORMAL HIGH (ref 13.0–17.0)
MCH: 31.7 pg (ref 26.0–34.0)
MCHC: 35.2 g/dL (ref 30.0–36.0)
MCV: 89.9 fL (ref 78.0–100.0)
Platelets: 386 10*3/uL (ref 150–400)
RBC: 5.46 MIL/uL (ref 4.22–5.81)
RDW: 14.4 % (ref 11.5–15.5)
WBC: 11.5 10*3/uL — ABNORMAL HIGH (ref 4.0–10.5)

## 2017-02-16 LAB — TROPONIN I: Troponin I: <0.03 ng/mL (ref <0.03)

## 2017-02-16 LAB — BASIC METABOLIC PANEL
Anion gap: 12 (ref 5–15)
BUN: 26 mg/dL — AB (ref 6–20)
CO2: 22 mmol/L (ref 22–32)
Calcium: 10.1 mg/dL (ref 8.9–10.3)
Chloride: 100 mmol/L — ABNORMAL LOW (ref 101–111)
Creatinine, Ser: 1.43 mg/dL — ABNORMAL HIGH (ref 0.61–1.24)
GFR calc Af Amer: >60 mL/min (ref >60)
GFR, EST NON AFRICAN AMERICAN: 55 mL/min — AB (ref >60)
GLUCOSE: 120 mg/dL — AB (ref 65–99)
POTASSIUM: 4.3 mmol/L (ref 3.5–5.1)
Sodium: 134 mmol/L — ABNORMAL LOW (ref 135–145)

## 2017-02-16 LAB — HEPATIC FUNCTION PANEL
ALT: 32 U/L (ref 17–63)
AST: 32 U/L (ref 15–41)
Albumin: 5.1 g/dL — ABNORMAL HIGH (ref 3.5–5.0)
Alkaline Phosphatase: 57 U/L (ref 38–126)
BILIRUBIN DIRECT: 0.2 mg/dL (ref 0.1–0.5)
BILIRUBIN INDIRECT: 0.6 mg/dL (ref 0.3–0.9)
TOTAL PROTEIN: 8.9 g/dL — AB (ref 6.5–8.1)
Total Bilirubin: 0.8 mg/dL (ref 0.3–1.2)

## 2017-02-16 LAB — TSH: TSH: 1.872 u[IU]/mL (ref 0.350–4.500)

## 2017-02-16 LAB — CK: Total CK: 172 U/L (ref 49–397)

## 2017-02-16 LAB — LIPASE, BLOOD: LIPASE: 34 U/L (ref 11–51)

## 2017-02-16 MED ORDER — SODIUM CHLORIDE 0.9 % IV BOLUS (SEPSIS)
1000.0000 mL | INTRAVENOUS | Status: AC
  Administered 2017-02-16 (×2): 1000 mL via INTRAVENOUS

## 2017-02-16 MED ORDER — ASPIRIN EC 81 MG PO TBEC
81.0000 mg | DELAYED_RELEASE_TABLET | Freq: Every day | ORAL | Status: DC
  Administered 2017-02-16 – 2017-02-17 (×2): 81 mg via ORAL
  Filled 2017-02-16 (×2): qty 1

## 2017-02-16 MED ORDER — ONDANSETRON HCL 4 MG PO TABS: 4.0000 mg | ORAL_TABLET | Freq: Four times a day (QID) | ORAL | Status: DC | PRN

## 2017-02-16 MED ORDER — ASPIRIN 81 MG PO CHEW
324.0000 mg | CHEWABLE_TABLET | Freq: Once | ORAL | Status: DC
  Filled 2017-02-16: qty 4

## 2017-02-16 MED ORDER — ACETAMINOPHEN 325 MG PO TABS: 650.0000 mg | ORAL_TABLET | Freq: Four times a day (QID) | ORAL | Status: DC | PRN

## 2017-02-16 MED ORDER — ENOXAPARIN SODIUM 40 MG/0.4ML ~~LOC~~ SOLN
40.0000 mg | SUBCUTANEOUS | Status: DC
  Administered 2017-02-16: 40 mg via SUBCUTANEOUS
  Filled 2017-02-16: qty 0.4

## 2017-02-16 MED ORDER — SODIUM CHLORIDE 0.9 % IV SOLN
INTRAVENOUS | Status: DC
  Administered 2017-02-16 – 2017-02-17 (×3): via INTRAVENOUS

## 2017-02-16 MED ORDER — GI COCKTAIL ~~LOC~~
30.0000 mL | Freq: Once | ORAL | Status: AC
  Administered 2017-02-16: 30 mL via ORAL

## 2017-02-16 MED ORDER — ONDANSETRON HCL 4 MG/2ML IJ SOLN: 4.0000 mg | Freq: Four times a day (QID) | INTRAMUSCULAR | Status: DC | PRN

## 2017-02-16 MED ORDER — ASPIRIN 81 MG PO CHEW
243.0000 mg | CHEWABLE_TABLET | Freq: Once | ORAL | Status: AC
  Administered 2017-02-16: 243 mg via ORAL

## 2017-02-16 MED ORDER — METHOCARBAMOL 500 MG PO TABS
500.0000 mg | ORAL_TABLET | Freq: Four times a day (QID) | ORAL | Status: DC | PRN
  Administered 2017-02-17: 500 mg via ORAL
  Filled 2017-02-16: qty 1

## 2017-02-16 MED ORDER — GI COCKTAIL ~~LOC~~
ORAL | Status: AC
  Filled 2017-02-16: qty 30

## 2017-02-16 MED ORDER — PANTOPRAZOLE SODIUM 40 MG PO TBEC
40.0000 mg | DELAYED_RELEASE_TABLET | Freq: Every day | ORAL | Status: DC
  Administered 2017-02-16 – 2017-02-17 (×2): 40 mg via ORAL
  Filled 2017-02-16 (×2): qty 1

## 2017-02-16 MED ORDER — ACETAMINOPHEN 650 MG RE SUPP: 650.0000 mg | Freq: Four times a day (QID) | RECTAL | Status: DC | PRN

## 2017-02-16 NOTE — ED Notes (Signed)
Pt walking to xray at this time. Refuses to sit in wheelchair or bed due to pain.  Dr. Clarene DukeMcmanus informed.

## 2017-02-16 NOTE — ED Notes (Signed)
Attempted report x 2 

## 2017-02-16 NOTE — ED Notes (Signed)
MD at bedside. 

## 2017-02-16 NOTE — ED Triage Notes (Addendum)
Pt reports left sided chest pain radiating to right side and into back.pt reports pain intermittently x1 week. Pt reports increase in severity today. Pt pale and mildly diaphoretic in triage.

## 2017-02-16 NOTE — H&P (Signed)
History and Physical    Ronnie Diaz VWU:[id number] DOB: 07-21-1966 DOA: 02/16/2017  PCP: Dwana Melena, MD  Patient coming from: home  I have personally briefly reviewed patient's old medical records in [facility]  Chief Complaint: cramping  HPI: Ronnie Diaz is a 51 y.o. male with medical history significant of HTN, HLD, GERD, who presents tot he hospital with chest pain. Patient reports squeezing type of pain located in left chest and radiating to right side. On further questioning, patient reports that for the past several days he has been having this squeezing type pain over his entire body, including his legs, back, chest and neck. He has associated shortness of breath when pain comes on, no diaphoresis. No cough or fever, no vomiting or diarrhea. Reports that his po intake has been adequate.  ED Course: EKG and troponin were negative, creatinine was noted to be elevated. Vitals stable. He received GI cocktail without significant improvement in pain.   Review of Systems: As per HPI otherwise 10 point review of systems negative.    Past Medical History:  Diagnosis Date  . Hypertension   . Muscle spasms of neck     Past Surgical History:  Procedure Laterality Date  . APPENDECTOMY       reports that he has been smoking Cigars.  He has never used smokeless tobacco. He reports that he does not drink alcohol or use drugs.  No Known Allergies  Family history: father died at the age of 28 from MI  Prior to Admission medications   Not on File    Physical Exam: Vitals:   02/16/17 1230 02/16/17 1330 02/16/17 1400 02/16/17 1430  BP: (!) 144/108 (!) 137/103 (!) 132/108 (!) 131/103  Pulse: 70 69 65 71  Resp: 14 17 17 19   Temp:      TempSrc:      SpO2: 97% 97% 96% 97%  Weight:      Height:        Constitutional: NAD, calm, comfortable Vitals:   02/16/17 1230 02/16/17 1330 02/16/17 1400 02/16/17 1430  BP: (!) 144/108 (!) 137/103 (!) 132/108 (!) 131/103    Pulse: 70 69 65 71  Resp: 14 17 17 19   Temp:      TempSrc:      SpO2: 97% 97% 96% 97%  Weight:      Height:       Eyes: PERRL, lids and conjunctivae normal ENMT: Mucous membranes are moist. Posterior pharynx clear of any exudate or lesions.Normal dentition.  Neck: normal, supple, no masses, no thyromegaly Respiratory: clear to auscultation bilaterally, no wheezing, no crackles. Normal respiratory effort. No accessory muscle use.  Cardiovascular: Regular rate and rhythm, no murmurs / rubs / gallops. No extremity edema. 2+ pedal pulses. No carotid bruits.  Abdomen: no tenderness, no masses palpated. No hepatosplenomegaly. Bowel sounds positive.  Musculoskeletal: no clubbing / cyanosis. No joint deformity upper and lower extremities. Good ROM, no contractures. Normal muscle tone.  Skin: no rashes, lesions, ulcers. No induration Neurologic: CN 2-12 grossly intact. Sensation intact, DTR normal. Strength 5/5 in all 4.  Psychiatric: Normal judgment and insight. Alert and oriented x 3. Normal mood.    Labs on Admission: I have personally reviewed following labs and imaging studies  CBC:  Recent Labs Lab 02/16/17 1120  WBC 11.5*  HGB 17.3*  HCT 49.1  MCV 89.9  PLT 386   Basic Metabolic Panel:  Recent Labs Lab 02/16/17 1120  NA 134*  K 4.3  CL 100*  CO2 22  GLUCOSE 120*  BUN 26*  CREATININE 1.43*  CALCIUM 10.1   GFR: Estimated Creatinine Clearance: 58.5 mL/min (A) (by C-G formula based on SCr of 1.43 mg/dL (H)). Liver Function Tests:  Recent Labs Lab 02/16/17 1120  AST 32  ALT 32  ALKPHOS 57  BILITOT 0.8  PROT 8.9*  ALBUMIN 5.1*    Recent Labs Lab 02/16/17 1120  LIPASE 34   No results for input(s): AMMONIA in the last 168 hours. Coagulation Profile: No results for input(s): INR, PROTIME in the last 168 hours. Cardiac Enzymes:  Recent Labs Lab 02/16/17 1120  TROPONINI <0.03   BNP (last 3 results) No results for input(s): PROBNP in the last 8760  hours. HbA1C: No results for input(s): HGBA1C in the last 72 hours. CBG: No results for input(s): GLUCAP in the last 168 hours. Lipid Profile: No results for input(s): CHOL, HDL, LDLCALC, TRIG, CHOLHDL, LDLDIRECT in the last 72 hours. Thyroid Function Tests: No results for input(s): TSH, T4TOTAL, FREET4, T3FREE, THYROIDAB in the last 72 hours. Anemia Panel: No results for input(s): VITAMINB12, FOLATE, FERRITIN, TIBC, IRON, RETICCTPCT in the last 72 hours. Urine analysis:    Component Value Date/Time   COLORURINE ORANGE BIOCHEMICALS MAY BE AFFECTED BY COLOR (A) 01/14/2011 0315   APPEARANCEUR TURBID (A) 01/14/2011 0315   LABSPEC 1.023 01/14/2011 0315   PHURINE 5.0 01/14/2011 0315   HGBUR SMALL (A) 01/14/2011 0315   BILIRUBINUR MODERATE (A) 01/14/2011 0315   KETONESUR NEGATIVE 01/14/2011 0315   PROTEINUR NEGATIVE 01/14/2011 0315   UROBILINOGEN 4.0 (H) 01/14/2011 0315   NITRITE POSITIVE (A) 01/14/2011 0315   LEUKOCYTESUR NEGATIVE 01/14/2011 0315    Radiological Exams on Admission: Dg Chest 2 View  Result Date: 02/16/2017 CLINICAL DATA:  Tightness in chest lower sternal area. EXAM: CHEST  2 VIEW COMPARISON:  03/10/2011 FINDINGS: Heart and mediastinal contours are within normal limits. No focal opacities or effusions. No acute bony abnormality. IMPRESSION: No active cardiopulmonary disease. Electronically Signed   By: Charlett NoseKevin  Dover M.D.   On: 02/16/2017 12:11    EKG: Independently reviewed. Sinus rhythm with nonspecific changes  Assessment/Plan Active Problems:   Chest pain   AKI (acute kidney injury) (HCC)   Dehydration   HTN (hypertension)   GERD (gastroesophageal reflux disease)   HLD (hyperlipidemia)    1. AKI. Likely related to dehydration. Continue on IV fluids.  2. Chest pain/muscle cramps. Will cycle troponins. Pain is atypical. ekg non acute. Will check CK. I suspect this may be related to dehydration.  3. HTN. Patient reports he normally takes meds at home. Wife  will bring this in  4. GERD. Continue on PPI  5. HLD. Wife reports he takes a medication at home. She will bring it in   DVT prophylaxis: lovenox Code Status: full code Family Communication: discussed with wife at the bedside Disposition Plan: discharge home when improved Consults called:  Admission status: observation, telemetry   Harleigh Civello MD [facility] Pager [phone]  If 7PM-7AM, please contact night-coverage [email or web address] Password [email or web address]  02/16/2017, 4:09 PM

## 2017-02-16 NOTE — ED Provider Notes (Signed)
[facility]-EMERGENCY DEPT Provider Note   CSN: [id number] Arrival date & time: 02/16/17  1104     History   Chief Complaint Chief Complaint  Patient presents with  . Chest Pain    HPI Ronnie Diaz is a 51 y.o. male.   Chest Pain    Pt was seen at 1120. Per pt, c/o gradual onset and persistence of multiple intermittent episodes of left sided chest "pain" for the past 1 week. Describes the CP as "squeezing," lasting approximately 15 minutes each episode. States pain will occasionally radiate into the right side of his chest. Pt states his symptoms were "worse" today; were associated with diaphoresis. Pt states he has been evaluated by his PMD previously for this complaint and was told "it was muscle spasms and I needed to take potassium." Denies having cardiac workup. Denies SOB, no palpitations, no abd pain, no N/V/D, no fevers, no rash.   Past Medical History:  Diagnosis Date  . Hypertension   . Muscle spasms of neck     Patient Active Problem List   Diagnosis Date Noted  . Chest pain 02/16/2017    Past Surgical History:  Procedure Laterality Date  . APPENDECTOMY         Home Medications    Prior to Admission medications   Not on File    Family History History reviewed. No pertinent family history.  Social History Social History  Substance Use Topics  . Smoking status: Current Every Day Smoker    Types: Cigars  . Smokeless tobacco: Never Used  . Alcohol use No     Allergies   Patient has no allergy information on record.   Review of Systems Review of Systems  Cardiovascular: Positive for chest pain.  ROS: Statement: All systems negative except as marked or noted in the HPI; Constitutional: Negative for fever and chills. ; ; Eyes: Negative for eye pain, redness and discharge. ; ; ENMT: Negative for ear pain, hoarseness, nasal congestion, sinus pressure and sore throat. ; ; Cardiovascular: +CP, diaphoresis. Negative for palpitations, dyspnea and  peripheral edema. ; ; Respiratory: Negative for cough, wheezing and stridor. ; ; Gastrointestinal: Negative for nausea, vomiting, diarrhea, abdominal pain, blood in stool, hematemesis, jaundice and rectal bleeding. . ; ; Genitourinary: Negative for dysuria, flank pain and hematuria. ; ; Musculoskeletal: Negative for back pain and neck pain. Negative for swelling and trauma.; ; Skin: Negative for pruritus, rash, abrasions, blisters, bruising and skin lesion.; ; Neuro: Negative for headache, lightheadedness and neck stiffness. Negative for weakness, altered level of consciousness, altered mental status, extremity weakness, paresthesias, involuntary movement, seizure and syncope.        Physical Exam Updated Vital Signs BP (!) 144/108   Pulse 70   Temp 97.4 F (36.3 C) (Oral)   Resp 14   Ht 5\' 8"  (1.727 m)   Wt 180 lb (81.6 kg)   SpO2 97%   BMI 27.37 kg/m   Physical Exam 1125: Physical examination:  Nursing notes reviewed; Vital signs and O2 SAT reviewed;  Constitutional: Well developed, Well nourished, Well hydrated, In no acute distress; Head:  Normocephalic, atraumatic; Eyes: EOMI, PERRL, No scleral icterus; ENMT: Mouth and pharynx normal, Mucous membranes moist; Neck: Supple, Full range of motion, No lymphadenopathy; Cardiovascular: Regular rate and rhythm, No gallop; Respiratory: Breath sounds clear & equal bilaterally, No wheezes.  Speaking full sentences with ease, Normal respiratory effort/excursion; Chest: Nontender, Movement normal; Abdomen: Soft, Nontender, Nondistended, Normal bowel sounds; Genitourinary: No CVA tenderness; Extremities: Pulses  normal, No tenderness, No edema, No calf edema or asymmetry.; Neuro: AA&Ox3, Major CN grossly intact.  Speech clear. No gross focal motor or sensory deficits in extremities.; Skin: Color normal, Warm, Dry.   ED Treatments / Results  Labs (all labs ordered are listed, but only abnormal results are displayed)   EKG  EKG  Interpretation  Date/Time:  Monday February 16 2017 11:09:21 EDT Ventricular Rate:  72 PR Interval:  228 QRS Duration: 90 QT Interval:  386 QTC Calculation: 422 R Axis:   -65 Text Interpretation:  Sinus rhythm with 1st degree A-V block Possible Left atrial enlargement Left anterior fascicular block Cannot rule out Anterior infarct , age undetermined No old tracing to compare Confirmed by [facility]  MD, Nicholos Johns [phone]) on 02/16/2017 11:30:18 AM       Radiology   Procedures Procedures (including critical care time)  Medications Ordered in ED Medications  0.9 %  sodium chloride infusion ( Intravenous New Bag/Given 02/16/17 1248)  aspirin chewable tablet 243 mg (243 mg Oral Given 02/16/17 1142)  gi cocktail (Maalox,Lidocaine,Donnatal) (30 mLs Oral Given 02/16/17 1153)     Initial Impression / Assessment and Plan / ED Course  I have reviewed the triage vital signs and the nursing notes.  Pertinent labs & imaging results that were available during my care of the patient were reviewed by me and considered in my medical decision making (see chart for details).  MDM Reviewed: previous chart, nursing note and vitals Reviewed previous: ECG and labs Interpretation: labs, ECG and x-ray   Results for orders placed or performed during the hospital encounter of 02/16/17  Basic metabolic panel  Result Value Ref Range   Sodium 134 (L) 135 - 145 mmol/L   Potassium 4.3 3.5 - 5.1 mmol/L   Chloride 100 (L) 101 - 111 mmol/L   CO2 22 22 - 32 mmol/L   Glucose, Bld 120 (H) 65 - 99 mg/dL   BUN 26 (H) 6 - 20 mg/dL   Creatinine, Ser 0.27 (H) 0.61 - 1.24 mg/dL   Calcium 25.3 8.9 - 66.4 mg/dL   GFR calc non Af Amer 55 (L) >60 mL/min   GFR calc Af Amer >60 >60 mL/min   Anion gap 12 5 - 15  CBC  Result Value Ref Range   WBC 11.5 (H) 4.0 - 10.5 K/uL   RBC 5.46 4.22 - 5.81 MIL/uL   Hemoglobin 17.3 (H) 13.0 - 17.0 g/dL   HCT 40.3 47.4 - 25.9 %   MCV 89.9 78.0 - 100.0 fL   MCH 31.7 26.0 - 34.0 pg    MCHC 35.2 30.0 - 36.0 g/dL   RDW 56.3 87.5 - 64.3 %   Platelets 386 150 - 400 K/uL  Troponin I  Result Value Ref Range   Troponin I <0.03 <0.03 ng/mL  Lipase, blood  Result Value Ref Range   Lipase 34 11 - 51 U/L  Hepatic function panel  Result Value Ref Range   Total Protein 8.9 (H) 6.5 - 8.1 g/dL   Albumin 5.1 (H) 3.5 - 5.0 g/dL   AST 32 15 - 41 U/L   ALT 32 17 - 63 U/L   Alkaline Phosphatase 57 38 - 126 U/L   Total Bilirubin 0.8 0.3 - 1.2 mg/dL   Bilirubin, Direct 0.2 0.1 - 0.5 mg/dL   Indirect Bilirubin 0.6 0.3 - 0.9 mg/dL   Dg Chest 2 View Result Date: 02/16/2017 CLINICAL DATA:  Tightness in chest lower sternal area. EXAM: CHEST  2  VIEW COMPARISON:  03/10/2011 FINDINGS: Heart and mediastinal contours are within normal limits. No focal opacities or effusions. No acute bony abnormality. IMPRESSION: No active cardiopulmonary disease. Electronically Signed   By: Charlett NoseKevin  Dover M.D.   On: 02/16/2017 12:11    1310:  Denies CP while in ED; ASA given. Pt c/o mid-epigastric "cramping" pain before going to XR; GI cocktail given with improvement.  BUN/Cr elevated from baseline; IVF given. Dx and testing d/w pt and family.  Questions answered.  Verb understanding, agreeable to observation admit. T/C to [facility] Dr. Kerry HoughMemon, case discussed, including:  HPI, pertinent PM/SHx, VS/PE, dx testing, ED course and treatment:  Agreeable to observation admit.     Final Clinical Impressions(s) / ED Diagnoses   Final diagnoses:  Chest pain, unspecified type    New Prescriptions New Prescriptions   No medications on file      Samuel JesterKathleen Jearldean Gutt, DO 02/20/17 1641

## 2017-02-16 NOTE — ED Notes (Signed)
Attempted report x1. 

## 2017-02-17 DIAGNOSIS — R079 Chest pain, unspecified: Secondary | ICD-10-CM | POA: Diagnosis not present

## 2017-02-17 DIAGNOSIS — K219 Gastro-esophageal reflux disease without esophagitis: Secondary | ICD-10-CM | POA: Diagnosis not present

## 2017-02-17 DIAGNOSIS — E860 Dehydration: Secondary | ICD-10-CM | POA: Diagnosis not present

## 2017-02-17 DIAGNOSIS — N179 Acute kidney failure, unspecified: Secondary | ICD-10-CM | POA: Diagnosis not present

## 2017-02-17 LAB — CBC
HCT: 42.4 % (ref 39.0–52.0)
HEMOGLOBIN: 14.5 g/dL (ref 13.0–17.0)
MCH: 31.3 pg (ref 26.0–34.0)
MCHC: 34.2 g/dL (ref 30.0–36.0)
MCV: 91.4 fL (ref 78.0–100.0)
Platelets: 289 10*3/uL (ref 150–400)
RBC: 4.64 MIL/uL (ref 4.22–5.81)
RDW: 14.6 % (ref 11.5–15.5)
WBC: 8.6 10*3/uL (ref 4.0–10.5)

## 2017-02-17 LAB — BASIC METABOLIC PANEL
ANION GAP: 7 (ref 5–15)
BUN: 18 mg/dL (ref 6–20)
CALCIUM: 8.7 mg/dL — AB (ref 8.9–10.3)
CO2: 22 mmol/L (ref 22–32)
CREATININE: 0.97 mg/dL (ref 0.61–1.24)
Chloride: 108 mmol/L (ref 101–111)
GFR calc Af Amer: >60 mL/min (ref >60)
GFR calc non Af Amer: >60 mL/min (ref >60)
GLUCOSE: 100 mg/dL — AB (ref 65–99)
Potassium: 4 mmol/L (ref 3.5–5.1)
Sodium: 137 mmol/L (ref 135–145)

## 2017-02-17 LAB — TROPONIN I: Troponin I: <0.03 ng/mL (ref <0.03)

## 2017-02-17 LAB — HIV ANTIBODY (ROUTINE TESTING W REFLEX): HIV Screen 4th Generation wRfx: NONREACTIVE

## 2017-02-17 NOTE — Discharge Summary (Signed)
Physician Discharge Summary  DOY TAAFFE ZOX:[id number] DOB: September 04, 1966 DOA: 02/16/2017  PCP: Dwana Melena, MD  Admit date: 02/16/2017 Discharge date: 02/17/2017  Admitted From: home Disposition:  home   Recommendations for Outpatient Follow-up:  1. Follow up with PCP in 1-2 weeks 2. Please obtain BMP/CBC in one week   Home Health: Equipment/Devices:  Discharge Condition:stable CODE STATUS: full code Diet recommendation: Heart Healthy  Brief/Interim Summary: This is a 51 year old male who was initially referred to the hospitalist service for admission to further workup chest pain. On further questioning, patient reports that he has muscle cramping in his chest. He also had cramps in his legs, back and neck. Review of lab work indicated dehydration and acute kidney injury. 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 His overall fluid intake is poor. Patient was admitted to telemetry and had negative troponins, ruling out ACS. EKG was unremarkable. He was hydrated overnight with resolution of kidney injury and dehydration. Overall he is feeling better and muscle cramps have resolved. He still has some muscle soreness. He is advised to continue hydration after discharge. Patient is otherwise feeling significantly improved and is ready for discharge home today.  Discharge Diagnoses:  Active Problems:   Chest pain   AKI (acute kidney injury) (HCC)   Dehydration   HTN (hypertension)   GERD (gastroesophageal reflux disease)   HLD (hyperlipidemia)    Discharge Instructions  Discharge Instructions    Diet - low sodium heart healthy    Complete by:  As directed    Increase activity slowly    Complete by:  As directed      Allergies as of 02/17/2017   No Known Allergies     Medication List    TAKE these medications   aspirin EC 81 MG tablet Take 81 mg by mouth at bedtime.   buPROPion 150 MG 24 hr tablet Commonly  known as:  WELLBUTRIN XL Take 150 mg by mouth daily.   BYSTOLIC 10 MG tablet Generic drug:  nebivolol Take 10 mg by mouth daily.   celecoxib 200 MG capsule Commonly known as:  CELEBREX Take 200 mg by mouth 2 (two) times daily.   cyclobenzaprine 10 MG tablet Commonly known as:  FLEXERIL Take 10 mg by mouth daily as needed for muscle spasms.   fenofibrate 160 MG tablet Take 160 mg by mouth daily.   pantoprazole 40 MG tablet Commonly known as:  PROTONIX Take 40 mg by mouth at bedtime.   Potassium 99 MG Tabs Take 1 tablet by mouth 2 (two) times daily.   simvastatin 20 MG tablet Commonly known as:  ZOCOR Take 20 mg by mouth at bedtime.   traZODone 50 MG tablet Commonly known as:  DESYREL TAKE 1-2 TABLETS BY MOUTH NIGHTLY AS NEEDED       No Known Allergies  Consultations:     Procedures/Studies: Dg Chest 2 View  Result Date: 02/16/2017 CLINICAL DATA:  Tightness in chest lower sternal area. EXAM: CHEST  2 VIEW COMPARISON:  03/10/2011 FINDINGS: Heart and mediastinal contours are within normal limits. No focal opacities or effusions. No acute bony abnormality. IMPRESSION: No active cardiopulmonary disease. Electronically Signed   By: Charlett Nose M.D.   On: 02/16/2017 12:11       Subjective: Patient is feeling better. Overall muscle soreness is better.  Discharge Exam: Vitals:   02/16/17 1800 02/16/17 2244  BP: 126/81 114/82  Pulse: 73 75  Resp:  16 18  Temp: 97.8 F (36.6 C) 98 F (36.7 C)   Vitals:   02/16/17 1400 02/16/17 1430 02/16/17 1800 02/16/17 2244  BP: (!) 132/108 (!) 131/103 126/81 114/82  Pulse: 65 71 73 75  Resp: 17 19 16 18   Temp:   97.8 F (36.6 C) 98 F (36.7 C)  TempSrc:    Oral  SpO2: 96% 97% 100% 99%  Weight:      Height:        General: Pt is alert, awake, not in acute distress Cardiovascular: RRR, S1/S2 +, no rubs, no gallops Respiratory: CTA bilaterally, no wheezing, no rhonchi Abdominal: Soft, NT, ND, bowel sounds  + Extremities: no edema, no cyanosis    The results of significant diagnostics from this hospitalization (including imaging, microbiology, ancillary and laboratory) are listed below for reference.     Microbiology: No results found for this or any previous visit (from the past 240 hour(s)).   Labs: BNP (last 3 results) No results for input(s): BNP in the last 8760 hours. Basic Metabolic Panel:  Recent Labs Lab 02/16/17 1120 02/17/17 0425  NA 134* 137  K 4.3 4.0  CL 100* 108  CO2 22 22  GLUCOSE 120* 100*  BUN 26* 18  CREATININE 1.43* 0.97  CALCIUM 10.1 8.7*   Liver Function Tests:  Recent Labs Lab 02/16/17 1120  AST 32  ALT 32  ALKPHOS 57  BILITOT 0.8  PROT 8.9*  ALBUMIN 5.1*    Recent Labs Lab 02/16/17 1120  LIPASE 34   No results for input(s): AMMONIA in the last 168 hours. CBC:  Recent Labs Lab 02/16/17 1120 02/17/17 0425  WBC 11.5* 8.6  HGB 17.3* 14.5  HCT 49.1 42.4  MCV 89.9 91.4  PLT 386 289   Cardiac Enzymes:  Recent Labs Lab 02/16/17 1120 02/16/17 1738 02/16/17 2209 02/17/17 0425  CKTOTAL  --  172  --   --   TROPONINI <0.03 <0.03 <0.03 <0.03   BNP: Invalid input(s): POCBNP CBG: No results for input(s): GLUCAP in the last 168 hours. D-Dimer No results for input(s): DDIMER in the last 72 hours. Hgb A1c No results for input(s): HGBA1C in the last 72 hours. Lipid Profile No results for input(s): CHOL, HDL, LDLCALC, TRIG, CHOLHDL, LDLDIRECT in the last 72 hours. Thyroid function studies  Recent Labs  02/16/17 1745  TSH 1.872   Anemia work up No results for input(s): VITAMINB12, FOLATE, FERRITIN, TIBC, IRON, RETICCTPCT in the last 72 hours. Urinalysis    Component Value Date/Time   COLORURINE ORANGE BIOCHEMICALS MAY BE AFFECTED BY COLOR (A) 01/14/2011 0315   APPEARANCEUR TURBID (A) 01/14/2011 0315   LABSPEC 1.023 01/14/2011 0315   PHURINE 5.0 01/14/2011 0315   HGBUR SMALL (A) 01/14/2011 0315   BILIRUBINUR MODERATE (A)  01/14/2011 0315   KETONESUR NEGATIVE 01/14/2011 0315   PROTEINUR NEGATIVE 01/14/2011 0315   UROBILINOGEN 4.0 (H) 01/14/2011 0315   NITRITE POSITIVE (A) 01/14/2011 0315   LEUKOCYTESUR NEGATIVE 01/14/2011 0315   Sepsis Labs Invalid input(s): PROCALCITONIN,  WBC,  LACTICIDVEN Microbiology No results found for this or any previous visit (from the past 240 hour(s)).   Time coordinating discharge: Over 30 minutes  SIGNED:   Erick BlinksMEMON,Anahli Arvanitis, MD  [facility] 02/17/2017, 12:11 PM Pager   If 7PM-7AM, please contact night-coverage [email or web address] Password [email or web address]

## 2017-02-17 NOTE — Progress Notes (Signed)
Discharge instructions given, verbalized understanding, out in stable condition via w/c with staff. 

## 2017-02-17 NOTE — Care Management Note (Signed)
Case Management Note  Patient Details  Name: Ronnie Diaz MRN: [id number] Date of Birth: June 20, 1966  Subjective/Objective:                  Pt admitted r/o chest pain. Chart reviewed for CM needs. Pt is from home, and ind with ADL's. Pt has PCP, transportation to appointments and insurance with drug coverage. No needs communicated.    Action/Plan: Pt discharging home today with self care.   Expected Discharge Date:  02/17/17               Expected Discharge Plan:  Home/Self Care  In-House Referral:  NA  Discharge planning Services  CM Consult  Post Acute Care Choice:  NA Choice offered to:  NA  Status of Service:  Completed, signed off  Malcolm MetroChildress, Dax Murguia Demske, RN 02/17/2017, 12:17 PM

## 2017-03-11 ENCOUNTER — Encounter: Payer: Self-pay | Admitting: Cardiovascular Disease

## 2017-03-31 ENCOUNTER — Encounter: Payer: Self-pay | Admitting: Cardiology

## 2017-03-31 NOTE — Progress Notes (Signed)
Cardiology Office Note  Date: 04/01/2017   ID: Ronnie Diaz, DOB [birth year], MRN [id number]  PCP: Benita Stabile, MD  Consulting Cardiologist: Nona Dell, MD   Chief Complaint  Patient presents with  . Chest Pain    History of Present Illness: Ronnie Diaz is a 51 y.o. male referred for cardiology consultation by Dr. Margo Aye for the assessment of chest pain.He is here today with his wife. He states that over the last 6 months he has been experiencing episodes of chest pain like a balloon filling up. Sometimes it is more of a sharp sudden discomfort. This is not specifically associated with any particular level of activity, sometimes when he twists his torso a certain way. 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 States that he likes to stay busy.  Records indicate hospitalization in March of this year at [facility] with cramping sensation diffusely, including the chest. He ruled out for ACS with negative troponin I levels, was felt to be clinically dehydrated. Creatinine improved from 1.4 to 0.97 with hydration and he did feel better clinically as well.  I reviewed his medications which are outlined below. He reports compliance. Blood pressure is adequately controlled today. He states that he underwent a stress test approximately 12 years ago, has not had follow-up testing. Cardiac risk factors include gender and age, hypertension, impaired glucose tolerance, and hyperlipidemia. He also has a history of tobacco use.  Past Medical History:  Diagnosis Date  . Anxiety   . Essential hypertension   . GERD (gastroesophageal reflux disease)   . Glucose tolerance decreased   . Hyperlipidemia   . Muscle spasms of neck     Past Surgical History:  Procedure Laterality Date  . APPENDECTOMY      Current Outpatient Prescriptions  Medication Sig Dispense Refill  . aspirin EC 81 MG tablet Take 81 mg by mouth at bedtime.    Marland Kitchen buPROPion (WELLBUTRIN XL) 150 MG 24  hr tablet Take 150 mg by mouth daily.     Marland Kitchen BYSTOLIC 10 MG tablet Take 10 mg by mouth daily.     . fenofibrate 160 MG tablet Take 160 mg by mouth daily.     . pantoprazole (PROTONIX) 40 MG tablet Take 40 mg by mouth at bedtime.     . Potassium 99 MG TABS Take 1 tablet by mouth 2 (two) times daily.    . simvastatin (ZOCOR) 20 MG tablet Take 20 mg by mouth at bedtime.     . traZODone (DESYREL) 50 MG tablet TAKE 1-2 TABLETS BY MOUTH NIGHTLY AS NEEDED  2   No current facility-administered medications for this visit.    Allergies:  Patient has no known allergies.   Social History: The patient  reports that he has been smoking Cigars.  He started smoking about 36 years ago. He has never used smokeless tobacco. He reports that he does not drink alcohol or use drugs.   Family History: The patient's family history includes Diabetes Mellitus II in his mother; Hypertension in his mother.   ROS:  Please see the history of present illness. Otherwise, complete review of systems is positive for history of bilateral bicep tears, some residual weakness in his arms with twisting motions.  All other systems are reviewed and negative.   Physical Exam: VS:  BP 118/76 (BP Location: Left Arm)   Pulse 65   Ht 5\' 8"  (1.727 m)   Wt 191 lb (86.6 kg)  SpO2 95%   BMI 29.04 kg/m , BMI Body mass index is 29.04 kg/m.  Wt Readings from Last 3 Encounters:  04/01/17 191 lb (86.6 kg)  02/16/17 180 lb (81.6 kg)    General: Patient appears comfortable at rest. HEENT: Conjunctiva and lids normal, oropharynx clear. Neck: Supple, no elevated JVP or carotid bruits, no thyromegaly. Lungs: Clear to auscultation, nonlabored breathing at rest. Cardiac: Regular rate and rhythm, no S3 or significant systolic murmur, no pericardial rub. Abdomen: Soft, nontender, bowel sounds present, no guarding or rebound. Extremities: No pitting edema, distal pulses 2+. Skin: Warm and dry. Musculoskeletal: No kyphosis. Neuropsychiatric:  Alert and oriented x3, affect grossly appropriate.  ECG: I personally reviewed the tracing from 02/16/2017 which showed sinus rhythm with prolonged PR interval, probable left atrial enlargement, left anterior fascicular block, decreased R wave progression.  Recent Labwork: 02/16/2017: ALT 32; AST 32; TSH 1.872 02/17/2017: BUN 18; Creatinine, Ser 0.97; Hemoglobin 14.5; Platelets 289; Potassium 4.0; Sodium 137  May 2018: Hemoglobin 15.0, platelets 331, BUN 18, creatinine 1.09, potassium 4.6, AST 20, ALT 21, cholesterol 169, triglycerides 136, HDL 38, LDL 104, hemoglobin A1c 5.7  Other Studies Reviewed Today:  Chest x-ray 02/16/2017: FINDINGS: Heart and mediastinal contours are within normal limits. No focal opacities or effusions. No acute bony abnormality.  IMPRESSION: No active cardiopulmonary disease.  Assessment and Plan:  1. Chest pain with atypical features, intermittent over the last 6 months. I reviewed his recent ECG which was abnormal but overall nonspecific. Cardiac risk factors include gender and age, tobacco use, hypertension, hyperlipidemia, and impaired glucose tolerance based on record review. He has not undergone recent ischemic testing. Plan is to obtain an exercise echocardiogram, he will hold Bystolic on the day of the test.  2. Tobacco abuse, smoking cessation indicated.  3. Essential hypertension, on Bystolic. Blood pressure is adequately controlled today.  4. Hyperlipidemia, on Zocor. Recent LDL 104. He follows with Dr. Margo AyeHall.  Current medicines were reviewed with the patient today.   Orders Placed This Encounter  Procedures  . ECHOCARDIOGRAM STRESS TEST    Disposition: Call with test results.  Signed, Jonelle SidleSamuel G. Brynley Cuddeback, MD, [facility]ACC 04/01/2017 10:39 AM    [facility] [address], [place], [place] [address] Phone: [phone]; Fax: [phone]

## 2017-04-01 ENCOUNTER — Ambulatory Visit (INDEPENDENT_AMBULATORY_CARE_PROVIDER_SITE_OTHER): Payer: BLUE CROSS/BLUE SHIELD | Admitting: Cardiology

## 2017-04-01 ENCOUNTER — Encounter: Payer: Self-pay | Admitting: Cardiology

## 2017-04-01 VITALS — BP 118/76 | HR 65 | Ht 68.0 in | Wt 191.0 lb

## 2017-04-01 DIAGNOSIS — I10 Essential (primary) hypertension: Secondary | ICD-10-CM

## 2017-04-01 DIAGNOSIS — R0789 Other chest pain: Secondary | ICD-10-CM

## 2017-04-01 DIAGNOSIS — E782 Mixed hyperlipidemia: Secondary | ICD-10-CM | POA: Diagnosis not present

## 2017-04-01 DIAGNOSIS — Z720 Tobacco use: Secondary | ICD-10-CM | POA: Diagnosis not present

## 2017-04-01 NOTE — Patient Instructions (Signed)
Medication Instructions:  Your physician recommends that you continue on your current medications as directed. Please refer to the Current Medication list given to you today.   Labwork: none  Testing/Procedures: Your physician has requested that you have a stress echocardiogram. For further information please visit [email or web address]. Please follow instruction sheet as given.    Follow-Up: Your physician recommends that you schedule a follow-up appointment in: do be determined, will call with test results   Any Other Special Instructions Will Be Listed Below (If Applicable).     If you need a refill on your cardiac medications before your next appointment, please call your pharmacy.

## 2017-04-08 ENCOUNTER — Ambulatory Visit (HOSPITAL_COMMUNITY)
Admission: RE | Admit: 2017-04-08 | Discharge: 2017-04-08 | Disposition: A | Discharge status: Home / Self Care | Payer: BLUE CROSS/BLUE SHIELD | Source: Ambulatory Visit | Attending: Cardiology | Admitting: Cardiology

## 2017-04-08 ENCOUNTER — Telehealth: Payer: Self-pay | Admitting: *Deleted

## 2017-04-08 DIAGNOSIS — K219 Gastro-esophageal reflux disease without esophagitis: Secondary | ICD-10-CM | POA: Insufficient documentation

## 2017-04-08 DIAGNOSIS — R000 Tachycardia, unspecified: Secondary | ICD-10-CM | POA: Insufficient documentation

## 2017-04-08 DIAGNOSIS — E785 Hyperlipidemia, unspecified: Secondary | ICD-10-CM | POA: Diagnosis not present

## 2017-04-08 DIAGNOSIS — R0789 Other chest pain: Secondary | ICD-10-CM

## 2017-04-08 DIAGNOSIS — I10 Essential (primary) hypertension: Secondary | ICD-10-CM | POA: Diagnosis not present

## 2017-04-08 LAB — ECHOCARDIOGRAM STRESS TEST
CHL CUP MPHR: 167 {beats}/min
CHL CUP RESTING HR STRESS: 68 {beats}/min
CSEPEDS: 25 s
CSEPHR: 85 %
CSEPPHR: 142 {beats}/min
Estimated workload: 10.1 METS
Exercise duration (min): 8 min
RPE: 15

## 2017-04-08 NOTE — Progress Notes (Addendum)
*  PRELIMINARY RESULTS* Echocardiogram Echocardiogram Stress Test has been performed.  Stacey DrainWhite, Bonnie Overdorf J 04/08/2017, 10:15 AM

## 2017-04-08 NOTE — Telephone Encounter (Signed)
Called patient with test results. No answer. Left message to call back.  

## 2017-04-08 NOTE — Telephone Encounter (Signed)
-----   Message from Jonelle SidleSamuel G McDowell, MD sent at 04/08/2017 11:42 AM EDT ----- Results reviewed. Please let him know that the stress test looked good. Low risk for cardiac events. No further cardiac workup planned at this time. Keep follow-up with Dr. Margo AyeHall. A copy of this test should be forwarded to Benita StabileHall, John Z, MD.

## 2017-10-15 ENCOUNTER — Encounter (HOSPITAL_COMMUNITY): Payer: Self-pay

## 2017-10-15 ENCOUNTER — Emergency Department (HOSPITAL_COMMUNITY): Payer: BLUE CROSS/BLUE SHIELD

## 2017-10-15 ENCOUNTER — Emergency Department (HOSPITAL_COMMUNITY)
Admission: EM | Admit: 2017-10-15 | Discharge: 2017-10-15 | Disposition: A | Discharge status: Home / Self Care | Payer: BLUE CROSS/BLUE SHIELD | Attending: Emergency Medicine | Admitting: Emergency Medicine

## 2017-10-15 DIAGNOSIS — I10 Essential (primary) hypertension: Secondary | ICD-10-CM | POA: Insufficient documentation

## 2017-10-15 DIAGNOSIS — Y9289 Other specified places as the place of occurrence of the external cause: Secondary | ICD-10-CM | POA: Diagnosis not present

## 2017-10-15 DIAGNOSIS — Y9389 Activity, other specified: Secondary | ICD-10-CM | POA: Diagnosis not present

## 2017-10-15 DIAGNOSIS — S93401A Sprain of unspecified ligament of right ankle, initial encounter: Secondary | ICD-10-CM | POA: Insufficient documentation

## 2017-10-15 DIAGNOSIS — S99911A Unspecified injury of right ankle, initial encounter: Secondary | ICD-10-CM | POA: Diagnosis present

## 2017-10-15 DIAGNOSIS — Z7982 Long term (current) use of aspirin: Secondary | ICD-10-CM | POA: Diagnosis not present

## 2017-10-15 DIAGNOSIS — Y999 Unspecified external cause status: Secondary | ICD-10-CM | POA: Insufficient documentation

## 2017-10-15 DIAGNOSIS — Z79899 Other long term (current) drug therapy: Secondary | ICD-10-CM | POA: Diagnosis not present

## 2017-10-15 DIAGNOSIS — F17290 Nicotine dependence, other tobacco product, uncomplicated: Secondary | ICD-10-CM | POA: Diagnosis not present

## 2017-10-15 DIAGNOSIS — W11XXXA Fall on and from ladder, initial encounter: Secondary | ICD-10-CM | POA: Insufficient documentation

## 2017-10-15 MED ORDER — IBUPROFEN 800 MG PO TABS: 800.0000 mg | ORAL_TABLET | Freq: Three times a day (TID) | ORAL | 0 refills | Status: AC

## 2017-10-15 NOTE — ED Notes (Signed)
Pt refused crutches and aso because he says he has both at home already.
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
## 2017-10-15 NOTE — ED Triage Notes (Signed)
Pt fell approx 12 feet from a ladder last night around 7pm.  C/O pain to r ankle.  Swelling noted.  Pedal pulse present, capillary refill wnl.

## 2017-11-04 ENCOUNTER — Other Ambulatory Visit (HOSPITAL_COMMUNITY): Payer: Self-pay | Admitting: Adult Health Nurse Practitioner

## 2017-11-04 ENCOUNTER — Ambulatory Visit (HOSPITAL_COMMUNITY)
Admission: RE | Admit: 2017-11-04 | Discharge: 2017-11-04 | Disposition: A | Discharge status: Home / Self Care | Payer: BLUE CROSS/BLUE SHIELD | Source: Ambulatory Visit | Attending: Adult Health Nurse Practitioner | Admitting: Adult Health Nurse Practitioner

## 2017-11-04 DIAGNOSIS — M25571 Pain in right ankle and joints of right foot: Secondary | ICD-10-CM | POA: Diagnosis present

## 2017-11-04 DIAGNOSIS — M79671 Pain in right foot: Secondary | ICD-10-CM

## 2019-03-02 IMAGING — DX DG CHEST 2V
2 series · 2 of 2 positions shown · non-contrast
Comparison: 03/10/2011

CLINICAL DATA: Tightness in chest lower sternal area.

EXAM:
CHEST  2 VIEW

[chest pa]
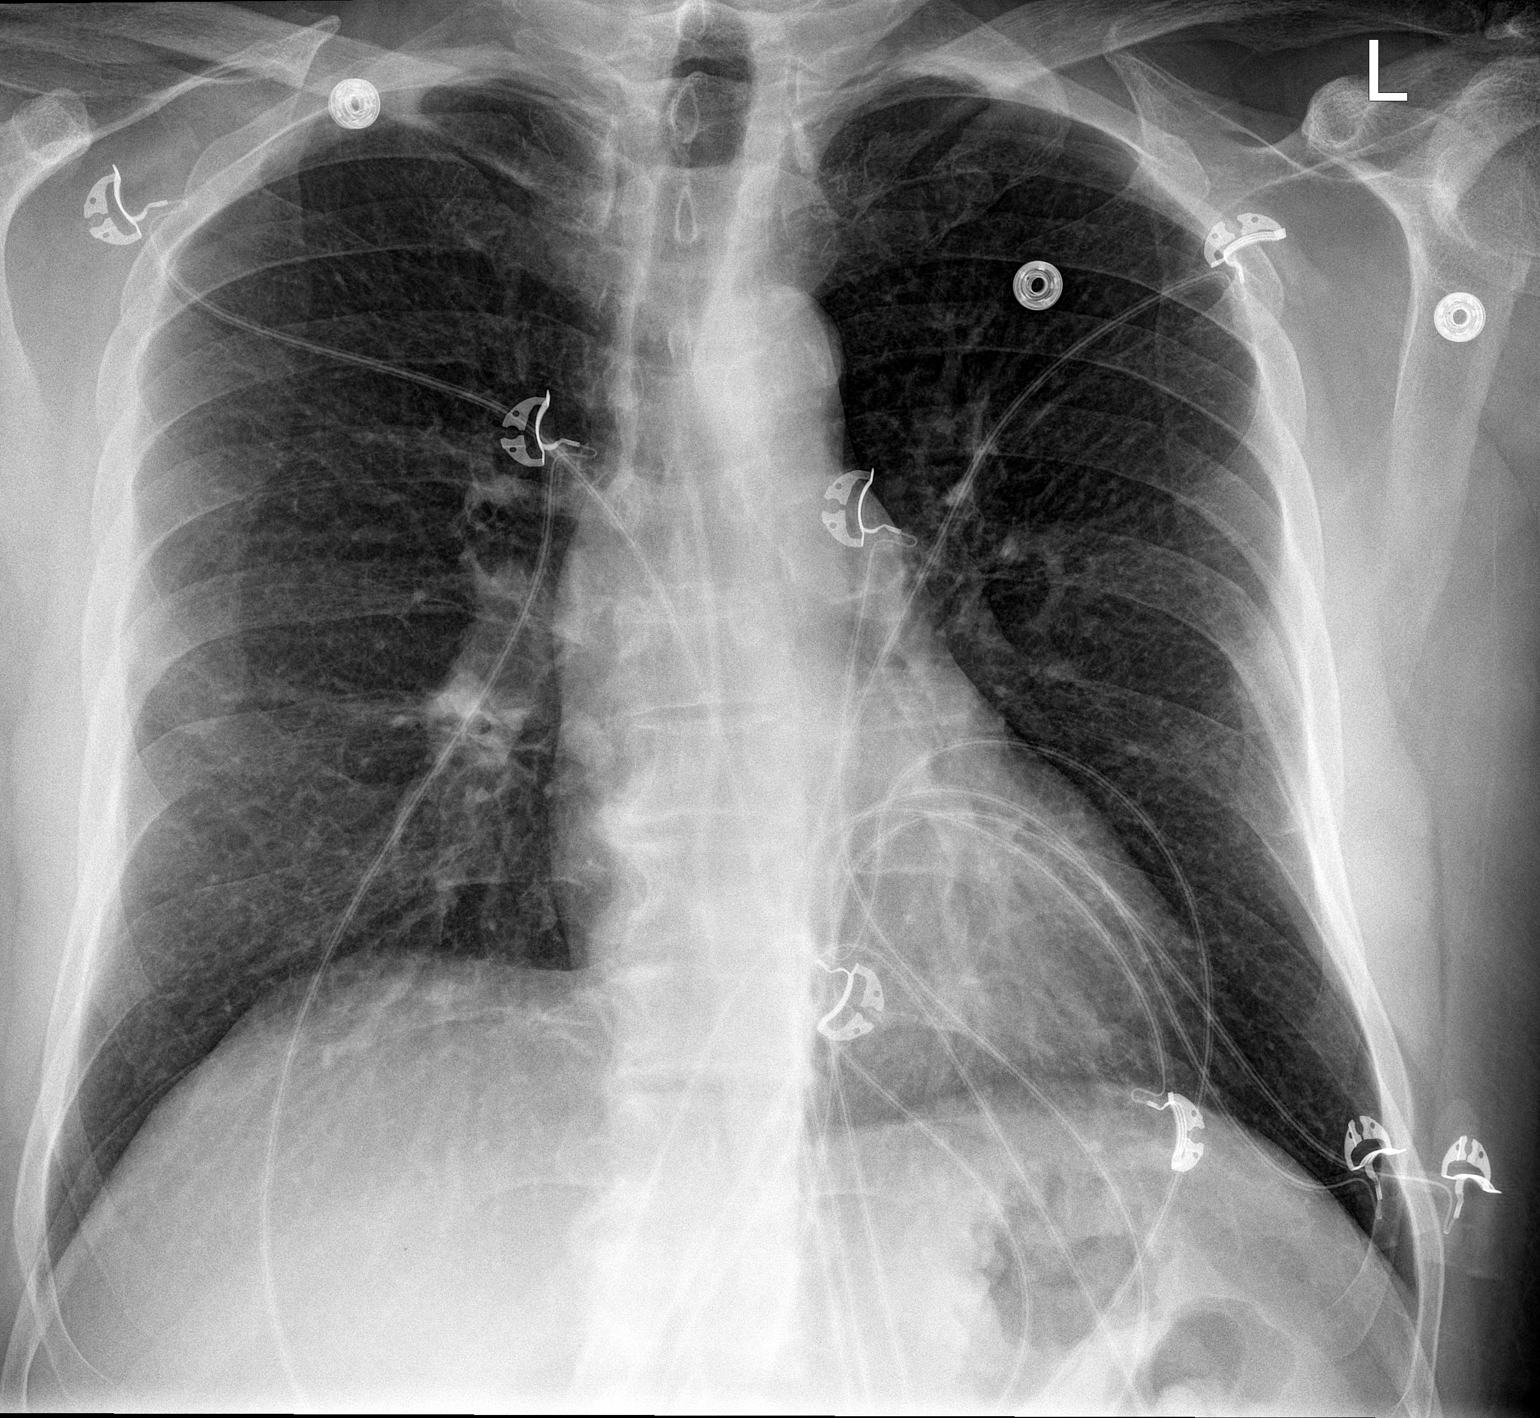

[chest lat]
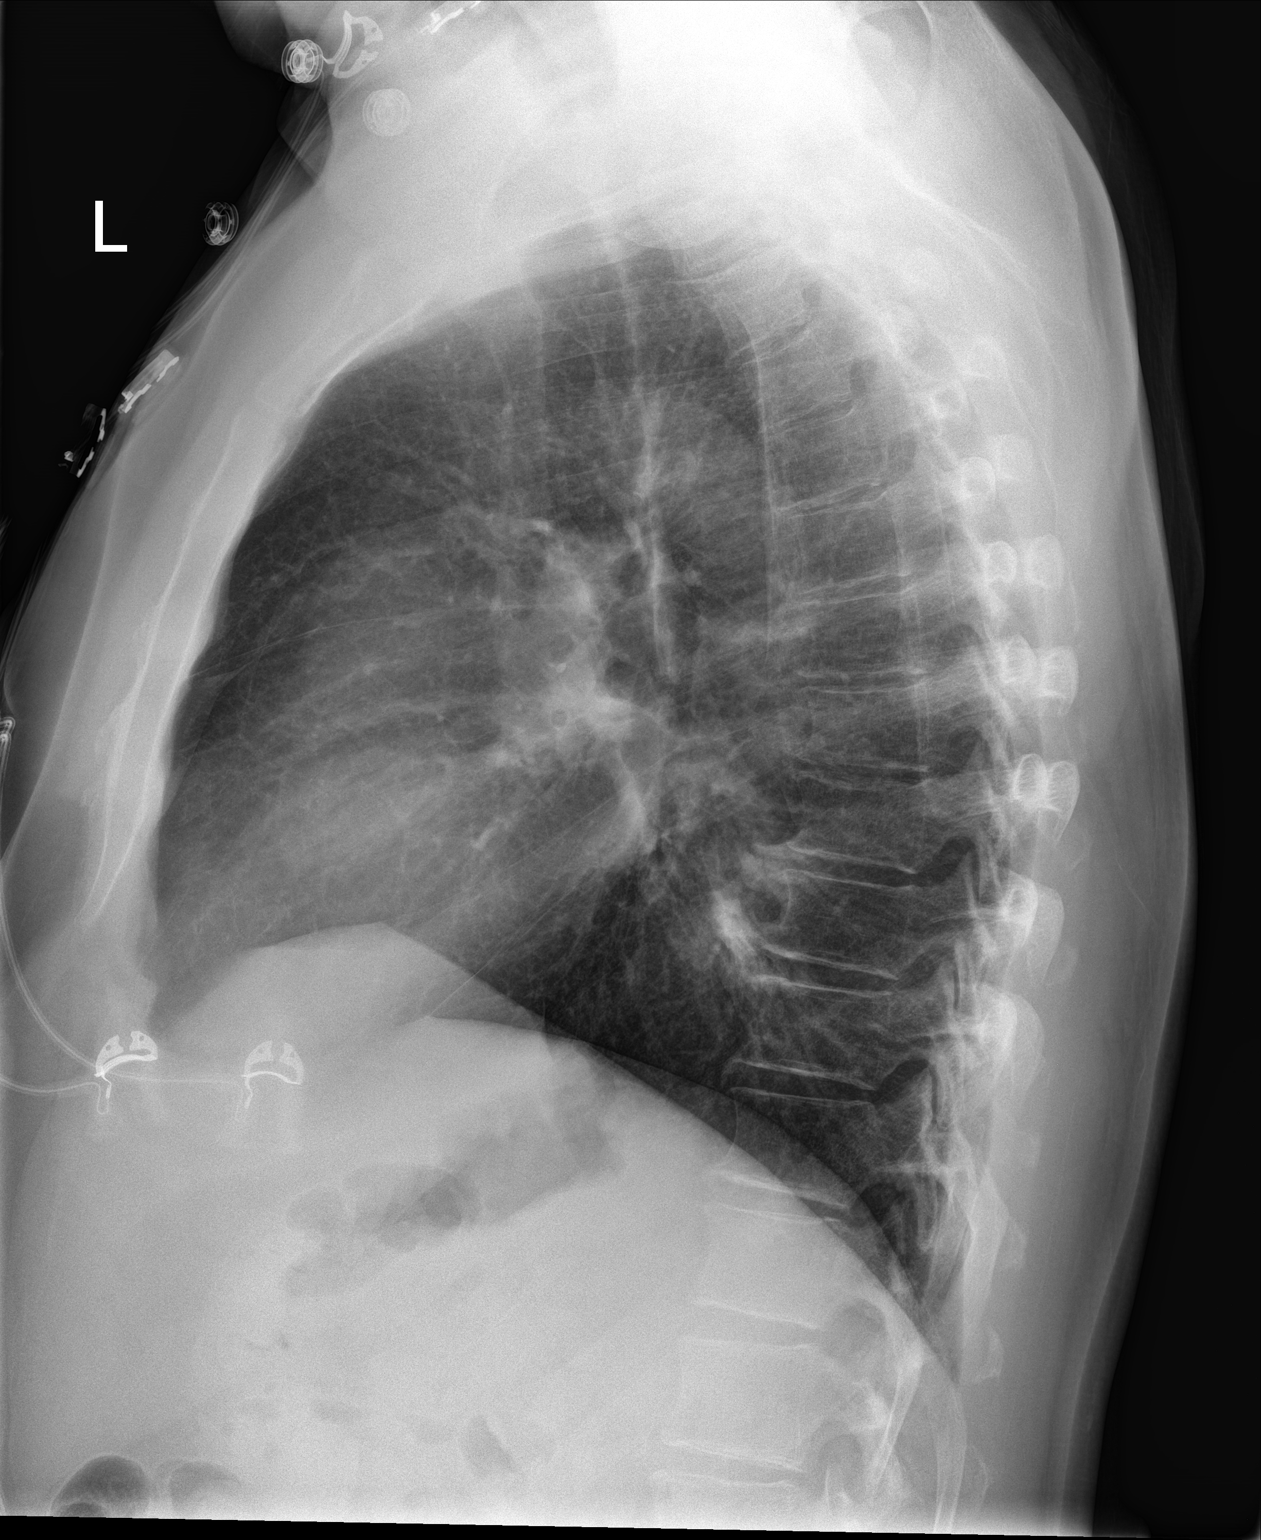

[2 of 2 positions shown; findings below may reference images not displayed]

FINDINGS: Heart and mediastinal contours are within normal limits. No focal
opacities or effusions. No acute bony abnormality.
IMPRESSION: No active cardiopulmonary disease.

## 2024-09-24 DIAGNOSIS — S52502A Unspecified fracture of the lower end of left radius, initial encounter for closed fracture: Secondary | ICD-10-CM

## 2024-09-24 HISTORY — DX: Unspecified fracture of the lower end of left radius, initial encounter for closed fracture: S52.502A

## 2024-10-18 ENCOUNTER — Encounter (HOSPITAL_COMMUNITY): Payer: Self-pay | Admitting: *Deleted

## 2024-10-18 ENCOUNTER — Emergency Department (HOSPITAL_COMMUNITY)

## 2024-10-18 ENCOUNTER — Other Ambulatory Visit: Payer: Self-pay

## 2024-10-18 ENCOUNTER — Emergency Department (HOSPITAL_COMMUNITY)
Admission: EM | Admit: 2024-10-18 | Discharge: 2024-10-18 | Disposition: A | Discharge status: Home / Self Care | Attending: Emergency Medicine | Admitting: Emergency Medicine

## 2024-10-18 DIAGNOSIS — S52532A Colles' fracture of left radius, initial encounter for closed fracture: Secondary | ICD-10-CM | POA: Diagnosis not present

## 2024-10-18 DIAGNOSIS — Z79899 Other long term (current) drug therapy: Secondary | ICD-10-CM | POA: Diagnosis not present

## 2024-10-18 DIAGNOSIS — W11XXXA Fall on and from ladder, initial encounter: Secondary | ICD-10-CM | POA: Diagnosis not present

## 2024-10-18 DIAGNOSIS — S59912A Unspecified injury of left forearm, initial encounter: Secondary | ICD-10-CM | POA: Diagnosis present

## 2024-10-18 DIAGNOSIS — Z7982 Long term (current) use of aspirin: Secondary | ICD-10-CM | POA: Insufficient documentation

## 2024-10-18 MED ORDER — OXYCODONE-ACETAMINOPHEN 5-325 MG PO TABS
1.0000 | ORAL_TABLET | Freq: Once | ORAL | Status: AC
  Administered 2024-10-18: 1 via ORAL
  Filled 2024-10-18: qty 1

## 2024-10-18 MED ORDER — FENTANYL CITRATE (PF) 100 MCG/2ML IJ SOLN
50.0000 ug | Freq: Once | INTRAMUSCULAR | Status: AC
  Administered 2024-10-18: 50 ug via INTRAMUSCULAR
  Filled 2024-10-18: qty 2

## 2024-10-18 MED ORDER — OXYCODONE-ACETAMINOPHEN 5-325 MG PO TABS: 1.0000 | ORAL_TABLET | Freq: Four times a day (QID) | ORAL | 0 refills | Status: AC | PRN

## 2024-10-18 NOTE — ED Notes (Signed)
 Provided patient with ice pack for affected wrist, patient stated it immediately began to make his pain 10 times worse and self removed ice pack. PO medication provided. Declines other non-pharmacologic management at this time.

## 2024-10-18 NOTE — Discharge Instructions (Signed)
 Please follow-up closely with orthopedics on an outpatient basis.  Return to emergency department immediately for any new or worsening symptoms.

## 2024-10-18 NOTE — ED Triage Notes (Signed)
 Pt states he was painting this am and was on a step ladder when he fell of landing on his left wrist  Pt states he noticed a deformity and wrapped it with horse tape  Pt's wrist is still wrapped

## 2024-10-18 NOTE — ED Provider Notes (Signed)
  EMERGENCY DEPARTMENT AT [facility] Provider Note   CSN: [id number] Arrival date & time: 10/18/24  [id number]     Patient presents with: Wrist Pain   Ronnie Diaz is a 58 y.o. male.   Patient is a 58 year old male who presents emergency department with a chief complaint of left wrist pain after falling off of a stepladder just prior to arrival.  He did not strike his head or injure his neck or back during the fall.  He denies any other long bone or joint pain.  He notes that he had a deformity initially and did pull in his wrist attempting to reduce it.  He denies any numbness or paresthesias distally.  He denies any associated chest pain or abdominal pain.  He notes that the fall was mechanical in nature with no preceding events.   Wrist Pain       Prior to Admission medications   Medication Sig Start Date End Date Taking? Authorizing Provider  oxyCODONE -acetaminophen  (PERCOCET/ROXICET) 5-325 MG tablet Take 1 tablet by mouth every 6 (six) hours as needed for severe pain (pain score 7-10). 10/18/24  Yes Daralene Lonni BIRCH PA-C  aspirin  EC 81 MG tablet Take 81 mg by mouth at bedtime.    [provider]  buPROPion (WELLBUTRIN XL) 150 MG 24 hr tablet Take 150 mg by mouth daily.  12/18/16   [provider]  BYSTOLIC 10 MG tablet Take 10 mg by mouth daily.  12/12/16   [provider]  fenofibrate 160 MG tablet Take 160 mg by mouth daily.  11/23/16   [provider]  ibuprofen  (ADVIL ,MOTRIN ) 800 MG tablet Take 1 tablet (800 mg total) by mouth 3 (three) times daily. 10/15/17   Rancour, Garnette, MD  pantoprazole  (PROTONIX ) 40 MG tablet Take 40 mg by mouth at bedtime.  11/23/16   [provider]  Potassium 99 MG TABS Take 1 tablet by mouth 2 (two) times daily.    [provider]  simvastatin (ZOCOR) 20 MG tablet Take 20 mg by mouth at bedtime.  12/04/16   [provider]  traZODone (DESYREL) 50 MG tablet  TAKE 1-2 TABLETS BY MOUTH NIGHTLY AS NEEDED 01/06/17   [provider]    Allergies: Patient has no known allergies.    Review of Systems  Musculoskeletal:        Left wrist pain  All other systems reviewed and are negative.   Updated Vital Signs BP (!) 146/91   Pulse 76   Temp 97.7 F (36.5 C)   Resp 18   Ht 5' 8 (1.727 m)   Wt 88.5 kg   SpO2 97%   BMI 29.68 kg/m   Physical Exam Vitals and nursing note reviewed.  Constitutional:      General: He is not in acute distress.    Appearance: Normal appearance. He is not ill-appearing.  HENT:     Head: Normocephalic and atraumatic.     Nose: Nose normal.     Mouth/Throat:     Mouth: Mucous membranes are moist.  Eyes:     Extraocular Movements: Extraocular movements intact.     Conjunctiva/sclera: Conjunctivae normal.     Pupils: Pupils are equal, round, and reactive to light.  Cardiovascular:     Rate and Rhythm: Normal rate and regular rhythm.     Pulses: Normal pulses.     Heart sounds: Normal heart sounds. No murmur heard.    No gallop.  Pulmonary:  Effort: Pulmonary effort is normal. No respiratory distress.     Breath sounds: Normal breath sounds. No stridor. No wheezing, rhonchi or rales.  Chest:     Chest wall: No tenderness.  Musculoskeletal:        General: Normal range of motion.     Cervical back: Normal range of motion and neck supple. No rigidity or tenderness.     Comments: Tender to palpation noted over the left wrist diffusely, nontender to palpation over the left hand, elbow, shoulder, edema noted of the left wrist, radial pulse 2+ and cap refill less than 2 seconds distally, sensation intact distally, radial, ulnar, median nerve function intact distally, no skin breakdown or ulceration, no lacerations or abrasions  Skin:    General: Skin is warm and dry.  Neurological:     General: No focal deficit present.     Mental Status: He is alert and oriented to person, place, and time. Mental  status is at baseline.  Psychiatric:        Mood and Affect: Mood normal.        Behavior: Behavior normal.        Thought Content: Thought content normal.        Judgment: Judgment normal.     (all labs ordered are listed, but only abnormal results are displayed) Labs Reviewed - No data to display  EKG: None  Radiology: DG Wrist Complete Left Result Date: 10/18/2024 CLINICAL DATA:  Wrist pain status post fall. EXAM: LEFT WRIST - COMPLETE 3+ VIEW COMPARISON:  None Available. FINDINGS: The bones appear mildly demineralized. There are acute fractures of the distal radius and ulna. The distal radial fracture is mildly impacted with mild posterior displacement and extension to the distal articular surface. There is a mildly displaced fracture of the ulnar styloid. The carpal bones appear intact and normally aligned. There is soft tissue swelling around the wrist without evidence of foreign body or soft tissue emphysema. IMPRESSION: Mildly displaced intra-articular fracture of the distal radius and mildly displaced fracture of the ulnar styloid. Electronically Signed   By: Elsie Perone M.D.   On: 10/18/2024 11:05     Procedures   Medications Ordered in the ED  oxyCODONE -acetaminophen  (PERCOCET/ROXICET) 5-325 MG per tablet 1 tablet (1 tablet Oral Given 10/18/24 1108)  fentaNYL  (SUBLIMAZE ) injection 50 mcg (50 mcg Intramuscular Given 10/18/24 1122)                                    Medical Decision Making Patient is doing well at this time and is stable for discharge home.  Patient does have a fracture to the distal radius and ulnar styloid of the left wrist.  This is not amenable to reduction at this point.  He was placed in a sugar-tong splint and was neurovascularly intact distally before and after splint placement.  He was directed to follow-up closely with orthopedic hand on an outpatient basis and he will call to make an appointment.  He will leave the splint in place until  evaluated by orthopedics.  Strict turn precautions were discussed for any new or worsening symptoms.  Patient had no other secondary injuries noted on physical exam.  Patient voiced understanding to the plan and had no additional questions.  Amount and/or Complexity of Data Reviewed Radiology: ordered.  Risk Prescription drug management.        Final diagnoses:  Closed Colles' fracture of  left radius, initial encounter    ED Discharge Orders          Ordered    oxyCODONE -acetaminophen  (PERCOCET/ROXICET) 5-325 MG tablet  Every 6 hours PRN        10/18/24 1151               Daralene Lonni BIRCH, PA-C 10/18/24 1153    Elnor Savant A, DO 10/20/24 [phone]

## 2024-10-22 NOTE — ED Notes (Signed)
 Pt called and requested to get a refill on his pain meds. CN informed pt that he will have to come in to be seen to receive a new script. Pt stated he did not want to come in for that. Pt going to Ortho Dr on Tues per pt.  10/22/2024 0930Hrs.

## 2024-10-26 ENCOUNTER — Encounter (HOSPITAL_BASED_OUTPATIENT_CLINIC_OR_DEPARTMENT_OTHER): Payer: Self-pay | Admitting: Orthopedic Surgery

## 2024-10-26 ENCOUNTER — Other Ambulatory Visit: Payer: Self-pay

## 2024-11-01 ENCOUNTER — Encounter (HOSPITAL_BASED_OUTPATIENT_CLINIC_OR_DEPARTMENT_OTHER)
Admission: RE | Admit: 2024-11-01 | Discharge: 2024-11-01 | Disposition: A | Source: Ambulatory Visit | Attending: Orthopedic Surgery

## 2024-11-02 ENCOUNTER — Ambulatory Visit (HOSPITAL_BASED_OUTPATIENT_CLINIC_OR_DEPARTMENT_OTHER): Admitting: Anesthesiology

## 2024-11-02 ENCOUNTER — Ambulatory Visit (HOSPITAL_BASED_OUTPATIENT_CLINIC_OR_DEPARTMENT_OTHER)

## 2024-11-02 ENCOUNTER — Encounter (HOSPITAL_BASED_OUTPATIENT_CLINIC_OR_DEPARTMENT_OTHER): Admission: RE | Disposition: A | Payer: Self-pay | Attending: Orthopedic Surgery

## 2024-11-02 ENCOUNTER — Other Ambulatory Visit: Payer: Self-pay

## 2024-11-02 ENCOUNTER — Ambulatory Visit (HOSPITAL_BASED_OUTPATIENT_CLINIC_OR_DEPARTMENT_OTHER)
Admission: RE | Admit: 2024-11-02 | Discharge: 2024-11-02 | Disposition: A | Attending: Orthopedic Surgery | Admitting: Orthopedic Surgery

## 2024-11-02 ENCOUNTER — Encounter (HOSPITAL_BASED_OUTPATIENT_CLINIC_OR_DEPARTMENT_OTHER): Payer: Self-pay | Admitting: Orthopedic Surgery

## 2024-11-02 DIAGNOSIS — I10 Essential (primary) hypertension: Secondary | ICD-10-CM

## 2024-11-02 HISTORY — PX: OPEN REDUCTION INTERNAL FIXATION (ORIF) DISTAL RADIAL FRACTURE: SHX5989

## 2024-11-02 SURGERY — OPEN REDUCTION INTERNAL FIXATION (ORIF) DISTAL RADIUS FRACTURE
Anesthesia: Monitor Anesthesia Care | Site: Hand | Laterality: Left

## 2024-11-02 MED ORDER — OXYCODONE HCL 5 MG PO TABS: 5.0000 mg | ORAL_TABLET | ORAL | 0 refills | Status: AC | PRN

## 2024-11-02 MED ORDER — MIDAZOLAM HCL 2 MG/2ML IJ SOLN
INTRAMUSCULAR | Status: AC
  Filled 2024-11-02: qty 2

## 2024-11-02 MED ORDER — MIDAZOLAM HCL (PF) 2 MG/2ML IJ SOLN
1.0000 mg | Freq: Once | INTRAMUSCULAR | Status: AC
  Administered 2024-11-02: 1 mg via INTRAVENOUS

## 2024-11-02 MED ORDER — MIDAZOLAM HCL 5 MG/5ML IJ SOLN
INTRAMUSCULAR | Status: DC | PRN
  Administered 2024-11-02: 2 mg via INTRAVENOUS

## 2024-11-02 MED ORDER — CEFAZOLIN SODIUM-DEXTROSE 2-4 GM/100ML-% IV SOLN
2.0000 g | INTRAVENOUS | Status: AC
  Administered 2024-11-02: 2 g via INTRAVENOUS

## 2024-11-02 MED ORDER — EPHEDRINE SULFATE (PRESSORS) 25 MG/5ML IV SOSY
PREFILLED_SYRINGE | INTRAVENOUS | Status: DC | PRN
  Administered 2024-11-02 (×2): 10 mg via INTRAVENOUS

## 2024-11-02 MED ORDER — FENTANYL CITRATE (PF) 100 MCG/2ML IJ SOLN
INTRAMUSCULAR | Status: DC | PRN
  Administered 2024-11-02: 100 ug via INTRAVENOUS
  Administered 2024-11-02 (×2): 50 ug via INTRAVENOUS

## 2024-11-02 MED ORDER — FENTANYL CITRATE (PF) 100 MCG/2ML IJ SOLN
INTRAMUSCULAR | Status: AC
  Filled 2024-11-02: qty 2

## 2024-11-02 MED ORDER — ACETAMINOPHEN 500 MG PO TABS
1000.0000 mg | ORAL_TABLET | Freq: Once | ORAL | Status: AC
  Administered 2024-11-02: 1000 mg via ORAL

## 2024-11-02 MED ORDER — FENTANYL CITRATE (PF) 100 MCG/2ML IJ SOLN
50.0000 ug | Freq: Once | INTRAMUSCULAR | Status: AC
  Administered 2024-11-02: 50 ug via INTRAVENOUS

## 2024-11-02 MED ORDER — AMISULPRIDE (ANTIEMETIC) 5 MG/2ML IV SOLN: 10.0000 mg | Freq: Once | INTRAVENOUS | Status: DC | PRN

## 2024-11-02 MED ORDER — ONDANSETRON HCL 4 MG/2ML IJ SOLN
INTRAMUSCULAR | Status: AC
  Filled 2024-11-02: qty 2

## 2024-11-02 MED ORDER — ROPIVACAINE HCL 5 MG/ML IJ SOLN
INTRAMUSCULAR | Status: DC | PRN
  Administered 2024-11-02: 30 mL via PERINEURAL

## 2024-11-02 MED ORDER — ONDANSETRON HCL 4 MG/2ML IJ SOLN
INTRAMUSCULAR | Status: DC | PRN
  Administered 2024-11-02: 4 mg via INTRAVENOUS

## 2024-11-02 MED ORDER — OXYCODONE HCL 5 MG PO TABS
ORAL_TABLET | ORAL | Status: AC
  Filled 2024-11-02: qty 1

## 2024-11-02 MED ORDER — PROPOFOL 10 MG/ML IV BOLUS
INTRAVENOUS | Status: AC
  Filled 2024-11-02: qty 20

## 2024-11-02 MED ORDER — CEFAZOLIN SODIUM-DEXTROSE 2-4 GM/100ML-% IV SOLN
INTRAVENOUS | Status: AC
  Filled 2024-11-02: qty 100

## 2024-11-02 MED ORDER — LIDOCAINE 2% (20 MG/ML) 5 ML SYRINGE
INTRAMUSCULAR | Status: DC | PRN
  Administered 2024-11-02: 100 mg via INTRAVENOUS

## 2024-11-02 MED ORDER — PROPOFOL 10 MG/ML IV BOLUS
INTRAVENOUS | Status: DC | PRN
  Administered 2024-11-02: 50 mg via INTRAVENOUS
  Administered 2024-11-02: 150 mg via INTRAVENOUS

## 2024-11-02 MED ORDER — OXYCODONE HCL 5 MG PO TABS
5.0000 mg | ORAL_TABLET | Freq: Once | ORAL | Status: AC | PRN
  Administered 2024-11-02: 5 mg via ORAL

## 2024-11-02 MED ORDER — PHENYLEPHRINE HCL (PRESSORS) 10 MG/ML IV SOLN
INTRAVENOUS | Status: DC | PRN
  Administered 2024-11-02 (×3): 80 ug via INTRAVENOUS

## 2024-11-02 MED ORDER — ACETAMINOPHEN 500 MG PO TABS
ORAL_TABLET | ORAL | Status: AC
  Filled 2024-11-02: qty 2

## 2024-11-02 MED ORDER — LIDOCAINE 2% (20 MG/ML) 5 ML SYRINGE
INTRAMUSCULAR | Status: AC
  Filled 2024-11-02: qty 5

## 2024-11-02 MED ORDER — FENTANYL CITRATE (PF) 100 MCG/2ML IJ SOLN
25.0000 ug | INTRAMUSCULAR | Status: DC | PRN
  Administered 2024-11-02 (×2): 50 ug via INTRAVENOUS

## 2024-11-02 MED ORDER — DEXAMETHASONE SODIUM PHOSPHATE 4 MG/ML IJ SOLN
INTRAMUSCULAR | Status: DC | PRN
  Administered 2024-11-02: 5 mg via INTRAVENOUS

## 2024-11-02 MED ORDER — LACTATED RINGERS IV SOLN: INTRAVENOUS | Status: DC

## 2024-11-02 MED ORDER — OXYCODONE HCL 5 MG/5ML PO SOLN: 5.0000 mg | Freq: Once | ORAL | Status: AC | PRN

## 2024-11-02 MED ORDER — DEXMEDETOMIDINE HCL IN NACL 80 MCG/20ML IV SOLN
INTRAVENOUS | Status: DC | PRN
  Administered 2024-11-02 (×2): 8 ug via INTRAVENOUS

## 2024-11-02 SURGICAL SUPPLY — 46 items
BIT DRILL SOLID 2.0X40MM (BIT) IMPLANT
BIT DRILL SOLID 2.5X40MM (BIT) IMPLANT
BLADE SURG 15 STRL LF DISP TIS (BLADE) ×1 IMPLANT
BNDG COMPR ESMARK 4X3 LF (GAUZE/BANDAGES/DRESSINGS) ×1 IMPLANT
BNDG ELASTIC 3INX 5YD STR LF (GAUZE/BANDAGES/DRESSINGS) ×1 IMPLANT
BNDG GAUZE DERMACEA FLUFF 4 (GAUZE/BANDAGES/DRESSINGS) ×1 IMPLANT
BNDG PLASTER X FAST 3X3 WHT LF (CAST SUPPLIES) ×10 IMPLANT
CHLORAPREP W/TINT 26 (MISCELLANEOUS) ×1 IMPLANT
CORD BIPOLAR FORCEPS 12FT (ELECTRODE) ×1 IMPLANT
COVER BACK TABLE 60X90IN (DRAPES) ×1 IMPLANT
CUFF TOURN SGL QUICK 18X4 (TOURNIQUET CUFF) ×1 IMPLANT
CUFF TRNQT CYL 24X4X16.5-23 (TOURNIQUET CUFF) IMPLANT
DRAPE EXTREMITY T 121X128X90 (DISPOSABLE) ×1 IMPLANT
DRAPE OEC MINIVIEW 54X84 (DRAPES) ×1 IMPLANT
DRAPE SURG 17X23 STRL (DRAPES) ×1 IMPLANT
DRIVER QUICK CONNECT T10 (MISCELLANEOUS) IMPLANT
GAUZE SPONGE 4X4 12PLY STRL (GAUZE/BANDAGES/DRESSINGS) ×1 IMPLANT
GAUZE XEROFORM 1X8 LF (GAUZE/BANDAGES/DRESSINGS) IMPLANT
GLOVE BIO SURGEON STRL SZ7 (GLOVE) ×1 IMPLANT
GOWN STRL REUS W/ TWL LRG LVL3 (GOWN DISPOSABLE) ×2 IMPLANT
GUIDE AIMING 1.5MM (WIRE) IMPLANT
NDL HYPO 25X1 1.5 SAFETY (NEEDLE) IMPLANT
PACK BASIN DAY SURGERY FS (CUSTOM PROCEDURE TRAY) ×1 IMPLANT
PAD CAST 3X4 CTTN HI CHSV (CAST SUPPLIES) ×1 IMPLANT
PEG GEMINUS SMOOTH LOCK 2.0X19 (Peg) IMPLANT
PEG GEMINUS THRD TPNL 2.7X26 (Peg) IMPLANT
PEG NON LOCK THREAD 2.7X24MM (Screw) IMPLANT
PEG SMOOTH LOCKING 20MM (Peg) IMPLANT
PLATE LEFT NARROW 3H (Plate) IMPLANT
SCREW GEMINUS CORT LOCK 3.5X13 (Screw) IMPLANT
SCREW GEMINUS PALS 2.5X20 (Screw) IMPLANT
SCREW GEMINUS PANL 3.5X13 (Screw) IMPLANT
SCREWDRIVER SURG ST 2 (INSTRUMENTS) IMPLANT
SHEET MEDIUM DRAPE 40X70 STRL (DRAPES) ×1 IMPLANT
SLEEVE SCD COMPRESS KNEE MED (STOCKING) IMPLANT
SLING ARM FOAM STRAP LRG (SOFTGOODS) IMPLANT
SOLN 0.9% NACL POUR BTL 1000ML (IV SOLUTION) ×1 IMPLANT
SPLINT FIBERGLASS 4X30 (CAST SUPPLIES) IMPLANT
SUT ETHILON 4 0 PS 2 18 (SUTURE) ×1 IMPLANT
SUT MNCRL AB 3-0 PS2 18 (SUTURE) ×1 IMPLANT
SUT VIC AB 4-0 PS2 18 (SUTURE) IMPLANT
SYR BULB EAR ULCER 3OZ GRN STR (SYRINGE) ×1 IMPLANT
SYR CONTROL 10ML LL (SYRINGE) IMPLANT
TOWEL GREEN STERILE FF (TOWEL DISPOSABLE) ×2 IMPLANT
UNDERPAD 30X36 HEAVY ABSORB (UNDERPADS AND DIAPERS) ×1 IMPLANT
WIRE FIX 1.5 STD TIP (WIRE) IMPLANT

## 2024-11-02 NOTE — Anesthesia Procedure Notes (Addendum)
 Anesthesia Regional Block: Supraclavicular block   Pre-Anesthetic Checklist: , timeout performed,  Correct Patient, Correct Site, Correct Laterality,  Correct Procedure, Correct Position, site marked,  Risks and benefits discussed,  Surgical consent,  Pre-op evaluation,  At surgeon's request and post-op pain management  Laterality: Left  Prep: chloraprep       Needles:  Injection technique: Single-shot  Needle Type: Echogenic Stimulator Needle     Needle Length: 9cm  Needle Gauge: 21     Additional Needles:   Procedures:,,,, ultrasound used (permanent image in chart),,    Narrative:  Start time: 11/02/2024 10:21 AM End time: 11/02/2024 10:26 AM Injection made incrementally with aspirations every 5 mL.  Performed by: Personally  Anesthesiologist: Peggye Delon Brunswick, MD  Additional Notes: Patient with pre-block numbness in the left index finger.  Discussed risks and benefits of nerve block including, but not limited to, prolonged and/or permanent nerve injury involving sensory and/or motor function. Monitors were applied and a time-out was performed. The nerve and associated structures were visualized under ultrasound guidance. After negative aspiration, local anesthetic was slowly injected around the nerve. There was no evidence of high pressure during the procedure. There were no paresthesias. VSS remained stable and the patient tolerated the procedure well.

## 2024-11-02 NOTE — H&P (Signed)
 HAND SURGERY   HPI: Patient is a 58 y.o. male who presents with a closed, left, intra-articular distal radius fracture.  He presents today for ORIF of the left distal radius.  Patient denies any changes to their medical history or new systemic symptoms today.    Past Medical History:  Diagnosis Date   Anxiety    Closed fracture of left distal radius 09/2024   Essential hypertension    GERD (gastroesophageal reflux disease)    Glucose tolerance decreased    Hyperlipidemia    Muscle spasms of neck    Past Surgical History:  Procedure Laterality Date   APPENDECTOMY     Social History   Socioeconomic History   Marital status: Married    Spouse name: Not on file   Number of children: Not on file   Years of education: Not on file   Highest education level: Not on file  Occupational History   Not on file  Tobacco Use   Smoking status: Former    Types: Cigars    Start date: 11/24/1980   Smokeless tobacco: Never  Vaping Use   Vaping status: Never Used  Substance and Sexual Activity   Alcohol use: No   Drug use: No   Sexual activity: Not on file  Other Topics Concern   Not on file  Social History Narrative   Not on file   Social Drivers of Health   Financial Resource Strain: Not on file  Food Insecurity: Not on file  Transportation Needs: Not on file  Physical Activity: Not on file  Stress: Not on file  Social Connections: Not on file   Family History  Problem Relation Age of Onset   Diabetes Mellitus II Mother    Hypertension Mother    - negative except otherwise stated in the family history section No Known Allergies Prior to Admission medications   Medication Sig Start Date End Date Taking? Authorizing Provider  ibuprofen  (ADVIL ,MOTRIN ) 800 MG tablet Take 1 tablet (800 mg total) by mouth 3 (three) times daily. 10/15/17  Yes Rancour, Garnette, MD  oxyCODONE -acetaminophen  (PERCOCET/ROXICET) 5-325 MG tablet Take 1 tablet by mouth every 6 (six) hours as needed  for severe pain (pain score 7-10). 10/18/24  Yes Daralene Lonni BIRCH, PA-C  aspirin  EC 81 MG tablet Take 81 mg by mouth at bedtime. Patient not taking: Reported on 10/26/2024    [provider]  buPROPion (WELLBUTRIN XL) 150 MG 24 hr tablet Take 150 mg by mouth daily.  Patient not taking: Reported on 10/26/2024 12/18/16   [provider]  BYSTOLIC 10 MG tablet Take 10 mg by mouth daily.  Patient not taking: Reported on 10/26/2024 12/12/16   [provider]  fenofibrate 160 MG tablet Take 160 mg by mouth daily.  Patient not taking: Reported on 10/26/2024 11/23/16   [provider]  pantoprazole  (PROTONIX ) 40 MG tablet Take 40 mg by mouth at bedtime.  Patient not taking: Reported on 10/26/2024 11/23/16   [provider]  Potassium 99 MG TABS Take 1 tablet by mouth 2 (two) times daily. Patient not taking: Reported on 10/26/2024    [provider]  simvastatin (ZOCOR) 20 MG tablet Take 20 mg by mouth at bedtime.  Patient not taking: Reported on 10/26/2024 12/04/16   [provider]  traZODone (DESYREL) 50 MG tablet TAKE 1-2 TABLETS BY MOUTH NIGHTLY AS NEEDED Patient not taking: Reported on 10/26/2024 01/06/17   [provider]   No results found. - Positive ROS:  All other systems have been reviewed and were otherwise negative with the exception of those mentioned in the HPI and as above.  Physical Exam: General: No acute distress, resting comfortably Cardiovascular: BUE warm and well perfused, normal rate Respiratory: Normal WOB on RA Skin: Warm and dry Neurologic: Sensation intact distally Psychiatric: Patient is at baseline mood and affect  Left Upper Extremity  Short arm splint is clean and dry.  Near full AROM of fingers but mildly limited by pain and swelling.  SILT m/u/r distributions.  Fingers pink and well perfused.     Assessment: 58 yo M w/ closed, left, intra-articular fracture of the distal radius.    Plan: OR today for ORIF of left distal radius.  We again reviewed the risks of surgery which include bleeding, infection, damage to neurovascular structures, persistent symptoms, nonunion, malunion, stiffness, need for additional surgery.  Informed consent was signed.  All questions were answered.   Bebe Galla, M.D. [facility] 9:54 AM

## 2024-11-02 NOTE — Op Note (Signed)
 Date of Surgery: 11/02/2024  INDICATIONS: Patient is a 58 y.o.-year-old male with a comminuted, intra-articular left distal radius fracture.  Risks, benefits, and alternatives to surgery were again discussed with the patient in the preoperative area. The patient wishes to proceed with surgery.  Informed consent was signed after our discussion.   PREOPERATIVE DIAGNOSIS:  Left distal radius fracture, intra-articular, >3 fragments  POSTOPERATIVE DIAGNOSIS: Same.  PROCEDURE:  ORIF left distal radius fracture, intra-articular, >3 fragments Brachioradialis release Intraoperative interpretation of 4 view radiographs of wrist   SURGEON: Carlin Galla, M.D.  ASSIST: None  ANESTHESIA:  General, regional  IV FLUIDS AND URINE: See anesthesia.  ESTIMATED BLOOD LOSS: 10 mL.  IMPLANTS:  Implant Name Type Inv. Item Serial No. Manufacturer Lot No. LRB No. Used Action  PLATE LEFT NARROW 3H - [id number] Plate PLATE LEFT NARROW 3H  SKELET[employer]  Left 1 Implanted  SCREW GEMINUS PANL 3.5X13 - [id number] Screw SCREW GEMINUS PANL 3.5X13  SKELETAL [employer]YNAM[employer]  Left 1 Implanted  PEG GEMINUS SMOOTH LOCK 2.0X19 - [id number] Peg PEG GEMINUS SMOOTH LOCK 2.0X19  SKELETAL [employer]YNAMICS  Left 3 Implanted  PEG SMOOTH LOCKING - [id number] Peg PEG SMOOTH LOCKING  SKELETAL [employer]YNAM[employer]  Left 3 Implanted     DRAINS: None  COMPLICATIONS: None noted  DESCRIPTION OF PROCEDURE: The patient was met in the preoperative holding area where the surgical site was marked and the consent form was signed. The patient was brought to the operating room and remained on the stretcher.  A hand table was placed adjacent to the operative extremity and locked into place.  All bony prominences were well-padded.  General endotracheal anesthesia was induced as his block was incomplete.  A tourniquet was placed high on the left upper arm.  The left upper extremity was then prepped and draped in the usual and sterile fashion.  A  formal timeout was performed to confirm that this is the correct patient, surgical side, surgical site, and surgical procedure.  All necessary implants were in the room.  Following formal timeout, a longitudinal incision was made directly overlying the flexor carpi radialis tendon.  The skin was incised.  Small crossing vessels were coagulated with bipolar cautery.  The volar aspect of the flexor carpi radialis tendon sheath was incised longitudinally.  The FCR tendon was retracted radially.  The floor of the FCR tendon sheath was similarly divided using a 15 blade scalpel.  Blunt dissection was used to develop Parona space between the flexor pollicis longus and pronator quadratus.  The flexor pollicis longus was retracted ulnarly.  An L-shaped tenotomy was performed along the distal and radial aspect of the pronator quadratus tendon.  The pronator quadratus was subperiosteally dissected off of the volar aspect of the radius and the distal fracture fragments using an elevator.  Blunt dissection was used to identify the brachioradialis tendon as it inserted on the radial aspect of the distal radius.  The underlying first dorsal compartment tendons were identified and protected.  A tenotomy of the brachioradialis was performed to aid in reduction of the fracture.  The fracture site was debrided of interposed soft tissue.  Reduction was performed using gentle traction and a Therapist, nutritional to lever the distal radius in a more anatomic alignment.  This reduction maneuver restored radial height, inclination, and volar tilt.  The articular surface remained congruent.  A 3-hole standard plate was selected.  This was placed at the volar surface of the radius.  This was fixed at the  midportion of the oblong hole to allow for fine-tuning of the distal to proximal position of the plate.  The fracture was held in a reduced position and the 2 K wires for the aiming guides were placed.  AP and lateral view showed that this was  an appropriate reduction of the fracture with appropriate K wire placement.  The K wires were in a good, subchondral location.  The distal holes in the plate were then filled with smooth locking pegs taking care that the pegs did not penetrate the dorsal cortex.  The screw from the radial styloid was drilled in a freehand fashion and a variable axis locking screw was placed.  I then placed 2 bicortical locking screws in the shaft of the plate.  AP, oblique, lateral, and 30 degree lateral views were taken.  These views were interpreted by me.  They demonstrated appropriate and anatomic reduction of the fracture with appropriate plate placement.  The plate was positioned proximal to the watershed line.  The distal locking screws were in a good, subchondral location.  The DRUJ was examined and found to be stable in all positions of the forearm.  The wound was then thoroughly irrigated with copious sterile saline.  The pronator quadratus was repaired using a 3-0 Monocryl suture.  A few 3-0 Monocryl sutures were placed in the subcutaneous tissue.  The tourniquet was deflated.  Hemostasis was achieved with combination of direct pressure and with bipolar electrocautery.  The skin was then further closed using a 4-0 Vicryl Rapide suture in horizontal mattress fashion.  The wound was then cleaned and dressed with Xeroform, folded Kerlix, cast padding, and a well-padded short arm splint was applied.  All fingertips were pink and well-perfused.  The patient was reversed from anesthesia and extubated uneventfully.  They were transferred from the operating table to the postoperative bed.  All counts were correct x 2 at the end of the procedure.  The patient was then taken to the PACU in stable condition.   POSTOPERATIVE PLAN: He will be discharged home with appropriate pain medication and discharge instructions.  A referral has been placed to hand therapy.  I will see him back in the office in 10 to 14 days for his first  postop visit.  Carlin Galla, MD 1:05 PM

## 2024-11-02 NOTE — Anesthesia Postprocedure Evaluation (Signed)
 Anesthesia Post Note  Patient: Ronnie Diaz  Procedure(s) Performed: OPEN REDUCTION INTERNAL FIXATION (ORIF) DISTAL RADIUS FRACTURE (Left: Hand)     Patient location during evaluation: PACU Anesthesia Type: Regional and General Level of consciousness: awake Pain management: pain level controlled Vital Signs Assessment: post-procedure vital signs reviewed and stable Respiratory status: spontaneous breathing, nonlabored ventilation and respiratory function stable Cardiovascular status: blood pressure returned to baseline and stable Postop Assessment: no apparent nausea or vomiting Anesthetic complications: no   No notable events documented.  Last Vitals:  Vitals:   11/02/24 1400 11/02/24 1402  BP: (!) 135/93   Pulse: 75 72  Resp: (!) 22 17  Temp:    SpO2: 94% 94%    Last Pain:  Vitals:   11/02/24 1345  TempSrc:   PainSc: 5                  Delon Aisha Arch

## 2024-11-02 NOTE — Interval H&P Note (Signed)
 History and Physical Interval Note:  11/02/2024 10:00 AM  Ronnie Diaz  has presented today for surgery, with the diagnosis of Left distal radius fracture, intra-articular, more than 3 fragments, left ulnar styloid fracture.  The various methods of treatment have been discussed with the patient and family. After consideration of risks, benefits and other options for treatment, the patient has consented to  Procedure(s) with comments: OPEN REDUCTION INTERNAL FIXATION (ORIF) DISTAL RADIUS FRACTURE (Left) - Open reduction internal fixation left distal radius fracture (intra-articular, >3 fragments), closed versus open treatment of ulnar styloid fracture as a surgical intervention.  The patient's history has been reviewed, patient examined, no change in status, stable for surgery.  I have reviewed the patient's chart and labs.  Questions were answered to the patient's satisfaction.     Aren Pryde

## 2024-11-02 NOTE — Transfer of Care (Signed)
 Immediate Anesthesia Transfer of Care Note  Patient: Ronnie Diaz  Procedure(s) Performed: OPEN REDUCTION INTERNAL FIXATION (ORIF) DISTAL RADIUS FRACTURE (Left: Hand)  Patient Location: PACU  Anesthesia Type:General  Level of Consciousness: awake and alert   Airway & Oxygen Therapy: Patient Spontanous Breathing and Patient connected to face mask oxygen  Post-op Assessment: Report given to RN and Post -op Vital signs reviewed and stable  Post vital signs: Reviewed and stable  Last Vitals:  Vitals Value Taken Time  BP 163/101 11/02/24 13:15  Temp    Pulse 81 11/02/24 13:16  Resp 17 11/02/24 13:16  SpO2 99 % 11/02/24 13:16  Vitals shown include unfiled device data.  Last Pain:  Vitals:   11/02/24 1006  TempSrc: Temporal  PainSc: 0-No pain         Complications: No notable events documented.

## 2024-11-02 NOTE — Anesthesia Preprocedure Evaluation (Addendum)
 Anesthesia Evaluation  Patient identified by MRN, date of birth, ID band Patient awake    Reviewed: Allergy & Precautions, NPO status , Patient's Chart, lab work & pertinent test results  History of Anesthesia Complications Negative for: history of anesthetic complications  Airway Mallampati: II  TM Distance: >3 FB Neck ROM: Full    Dental  (+) Poor Dentition, Missing, Dental Advisory Given, Chipped   Pulmonary neg shortness of breath, neg sleep apnea, neg COPD, neg recent URI, former smoker   Pulmonary exam normal breath sounds clear to auscultation       Cardiovascular hypertension (bystolic), Pt. on home beta blockers (-) angina (-) Past MI, (-) Cardiac Stents and (-) CABG + dysrhythmias (1st degree AV block)  Rhythm:Regular Rate:Normal  HLD  Stress Echo 04/08/2017: Study Conclusions   - Stress ECG conclusions: The stress ECG was negative for ischemia.    [facility] scoring: exercise time of 8.5 min; maximum ST deviation of 0    mm; no angina; resulting score is 9. This score predicts a low    risk of cardiac events.  - Staged echo: There was no echocardiographic evidence for    stress-induced ischemia.     Neuro/Psych  PSYCHIATRIC DISORDERS Anxiety     negative neurological ROS     GI/Hepatic Neg liver ROS,GERD  Medicated,,  Endo/Other  negative endocrine ROS    Renal/GU negative Renal ROS     Musculoskeletal   Abdominal   Peds  Hematology negative hematology ROS (+)   Anesthesia Other Findings   Reproductive/Obstetrics                              Anesthesia Physical Anesthesia Plan  ASA: 2  Anesthesia Plan: MAC and Regional   Post-op Pain Management: Regional block* and Tylenol  PO (pre-op)*   Induction:   PONV Risk Score and Plan: 1 and Ondansetron , Dexamethasone, Propofol  infusion, TIVA, Midazolam  and Treatment may vary due to age or medical condition  Airway Management  Planned: Natural Airway and Simple Face Mask  Additional Equipment:   Intra-op Plan:   Post-operative Plan:   Informed Consent: I have reviewed the patients History and Physical, chart, labs and discussed the procedure including the risks, benefits and alternatives for the proposed anesthesia with the patient or authorized representative who has indicated his/her understanding and acceptance.     Dental advisory given  Plan Discussed with: CRNA and Anesthesiologist  Anesthesia Plan Comments: (Discussed potential risks of nerve blocks including, but not limited to, infection, bleeding, nerve damage, seizures, pneumothorax, respiratory depression, and potential failure of the block. Alternatives to nerve blocks discussed. All questions answered.  Discussed with patient risks of MAC including, but not limited to, minor pain or discomfort, hearing people in the room, and possible need for backup general anesthesia. Risks for general anesthesia also discussed including, but not limited to, sore throat, hoarse voice, chipped/damaged teeth, injury to vocal cords, nausea and vomiting, allergic reactions, lung infection, heart attack, stroke, and death. All questions answered. )         Anesthesia Quick Evaluation

## 2024-11-02 NOTE — Anesthesia Procedure Notes (Signed)
 Procedure Name: LMA Insertion Date/Time: 11/02/2024 11:38 AM  Performed by: Pam Macario BROCKS, CRNAPre-anesthesia Checklist: Patient identified, Emergency Drugs available, Suction available, Patient being monitored and Timeout performed Patient Re-evaluated:Patient Re-evaluated prior to induction Oxygen Delivery Method: Circle system utilized Preoxygenation: Pre-oxygenation with 100% oxygen Induction Type: IV induction Ventilation: Mask ventilation without difficulty LMA: LMA inserted LMA Size: 5.0 Number of attempts: 1 Airway Equipment and Method: Bite block Placement Confirmation: positive ETCO2, breath sounds checked- equal and bilateral and CO2 detector Tube secured with: Tape Dental Injury: Teeth and Oropharynx as per pre-operative assessment

## 2024-11-02 NOTE — Discharge Instructions (Addendum)
 No tylenol  before 1:15 pm. May take next pain pill after 6:15 pm if needed.    Carlin Galla, M.D. Hand Surgery  POST-OPERATIVE DISCHARGE INSTRUCTIONS   PRESCRIPTIONS: You may have been given a prescription to be taken as directed for post-operative pain control.  You may also take over the counter ibuprofen /aleve and tylenol  for pain. Take this as directed on the packaging. Do not exceed 3000 mg tylenol /acetaminophen  in 24 hours.  Ibuprofen  600-800 mg (3-4) tablets by mouth every 6 hours as needed for pain.  OR Aleve 2 tablets by mouth every 12 hours (twice daily) as needed for pain.  AND/OR Tylenol  1000 mg (2 tablets) every 8 hours as needed for pain.  Please use your pain medication carefully, as refills are limited and you may not be provided with one.  As stated above, please use over the counter pain medicine - it will also be helpful with decreasing your swelling.    ANESTHESIA: After your surgery, post-surgical discomfort or pain is likely. This discomfort can last several days to a few weeks. At certain times of the day your discomfort may be more intense.   Did you receive a nerve block?  A nerve block can provide pain relief for one hour to two days after your surgery. As long as the nerve block is working, you will experience little or no sensation in the area the surgeon operated on.  As the nerve block wears off, you will begin to experience pain or discomfort. It is very important that you begin taking your prescribed pain medication before the nerve block fully wears off. Treating your pain at the first sign of the block wearing off will ensure your pain is better controlled and more tolerable when full-sensation returns. Do not wait until the pain is intolerable, as the medicine will be less effective. It is better to treat pain in advance than to try and catch up.   General Anesthesia:  If you did not receive a nerve block during your surgery, you will need to  start taking your pain medication shortly after your surgery and should continue to do so as prescribed by your surgeon.     ICE AND ELEVATION: You may use ice for the first 48-72 hours, but it is not critical.   Motion of your fingers is very important to decrease the swelling.  Elevation, as much as possible for the next 48 hours, is critical for decreasing swelling as well as for pain relief. Elevation means when you are seated or lying down, you hand should be at or above your heart. When walking, the hand needs to be at or above the level of your elbow.  If the bandage gets too tight, it may need to be loosened. Please contact our office and we will instruct you in how to do this.    SURGICAL BANDAGES:  Keep your dressing and/or splint clean and dry at all times.  Do not remove until you are seen again in the office.  If careful, you may place a plastic bag over your bandage and tape the end to shower, but be careful, do not get your bandages wet.     HAND THERAPY:  You will be contacted to set up your first therapy visit.    ACTIVITY AND WORK: You are encouraged to move any fingers which are not in the bandage.  Light use of the fingers is allowed to assist the other hand with daily hygiene and eating, but strong  gripping or lifting is often uncomfortable and should be avoided.  You might miss a variable period of time from work and hopefully this issue has been discussed prior to surgery. You may not do any heavy work with your affected hand for about 2 weeks.    [facility] [address] Floor, [address] [place], [place] [address] [phone]    Post Anesthesia Home Care Instructions  Activity: Get plenty of rest for the remainder of the day. A responsible individual must stay with you for 24 hours following the procedure.  For the next 24 hours, DO NOT: -Drive a car -Advertising copywriter -Drink alcoholic beverages -Take any medication unless instructed by your  physician -Make any legal decisions or sign important papers.  Meals: Start with liquid foods such as gelatin or soup. Progress to regular foods as tolerated. Avoid greasy, spicy, heavy foods. If nausea and/or vomiting occur, drink only clear liquids until the nausea and/or vomiting subsides. Call your physician if vomiting continues.  Special Instructions/Symptoms: Your throat may feel dry or sore from the anesthesia or the breathing tube placed in your throat during surgery. If this causes discomfort, gargle with warm salt water. The discomfort should disappear within 24 hours.  If you had a scopolamine patch placed behind your ear for the management of post- operative nausea and/or vomiting:  1. The medication in the patch is effective for 72 hours, after which it should be removed.  Wrap patch in a tissue and discard in the trash. Wash hands thoroughly with soap and water. 2. You may remove the patch earlier than 72 hours if you experience unpleasant side effects which may include dry mouth, dizziness or visual disturbances. 3. Avoid touching the patch. Wash your hands with soap and water after contact with the patch.

## 2024-11-02 NOTE — Progress Notes (Signed)
 Assisted Dr. Ardeen Jourdain with left, supraclavicular, ultrasound guided block. Side rails up, monitors on throughout procedure. See vital signs in flow sheet. Tolerated Procedure well.

## 2024-11-03 ENCOUNTER — Encounter (HOSPITAL_BASED_OUTPATIENT_CLINIC_OR_DEPARTMENT_OTHER): Payer: Self-pay | Admitting: Orthopedic Surgery
